# Patient Record
Sex: Male | Born: 1970
Health system: Southern US, Community
[De-identification: ages and names within clinical notes are randomized; demographics above are authoritative.]

## PROBLEM LIST (undated history)

## (undated) DIAGNOSIS — N200 Calculus of kidney: Secondary | ICD-10-CM

## (undated) DIAGNOSIS — K760 Fatty (change of) liver, not elsewhere classified: Secondary | ICD-10-CM

## (undated) DIAGNOSIS — R7303 Prediabetes: Secondary | ICD-10-CM

## (undated) DIAGNOSIS — G4734 Idiopathic sleep related nonobstructive alveolar hypoventilation: Principal | ICD-10-CM

## (undated) DIAGNOSIS — G4733 Obstructive sleep apnea (adult) (pediatric): Secondary | ICD-10-CM

## (undated) DIAGNOSIS — R42 Dizziness and giddiness: Secondary | ICD-10-CM

## (undated) HISTORY — DX: Dizziness and giddiness: R42

## (undated) HISTORY — DX: Obstructive sleep apnea (adult) (pediatric): G47.33

## (undated) HISTORY — DX: Calculus of kidney: N20.0

## (undated) HISTORY — PX: LITHOTRIPSY: SUR834

## (undated) HISTORY — PX: PALATE SURGERY: SHX729

## (undated) HISTORY — PX: TONSILLECTOMY AND ADENOIDECTOMY: SHX28

## (undated) HISTORY — DX: Idiopathic sleep related nonobstructive alveolar hypoventilation: G47.34

## (undated) HISTORY — DX: Fatty (change of) liver, not elsewhere classified: K76.0

## (undated) HISTORY — DX: Prediabetes: R73.03

---

## 1999-08-27 ENCOUNTER — Ambulatory Visit: Admission: RE | Admit: 1999-08-27 | Discharge: 1999-08-27 | Payer: Self-pay | Admitting: Otolaryngology

## 1999-12-06 ENCOUNTER — Inpatient Hospital Stay (HOSPITAL_COMMUNITY): Admission: RE | Admit: 1999-12-06 | Discharge: 1999-12-08 | Payer: Self-pay | Admitting: Otolaryngology

## 2000-06-21 ENCOUNTER — Emergency Department (HOSPITAL_COMMUNITY): Admission: EM | Admit: 2000-06-21 | Discharge: 2000-06-21 | Payer: Self-pay | Admitting: Emergency Medicine

## 2009-06-10 HISTORY — PX: KNEE SURGERY: SHX244

## 2010-11-29 ENCOUNTER — Encounter: Payer: Self-pay | Admitting: Family Medicine

## 2010-11-29 ENCOUNTER — Ambulatory Visit (INDEPENDENT_AMBULATORY_CARE_PROVIDER_SITE_OTHER): Payer: Self-pay | Admitting: Family Medicine

## 2010-11-29 VITALS — BP 132/75 | HR 71 | Ht 77.0 in | Wt 300.0 lb

## 2010-11-29 DIAGNOSIS — R42 Dizziness and giddiness: Secondary | ICD-10-CM

## 2010-11-29 NOTE — Progress Notes (Signed)
  Subjective:    Patient ID: Adam Hamilton, male    DOB: 01/09/1971, 40 y.o.   MRN: [id number]  HPI Dizziness with standing up x 6 months.  3 days a week driving an hour to work. Can happen after he has been is sitting for an extended period of time. No so much at home.  Will get up to walk and will all the sudden get dizzy.  Feels fain like he is going to pass out.  After 15-20 seconds and then resolve. No room spinning.  No blood in your urine or stool.  He does have one kidney larger than the other.  Does have frequent kidney stones. No recent vision changes.  Normal eye exam.  No HA.  No numbness or tingling now but when he stands up he feels tingling starts in his waist and radiates down his legs. Tingling in the tops of his feet.  No leg swelling. No syncopal episodes.    Review of Systems     Objective:   Physical Exam  Constitutional: He is oriented to person, place, and time. He appears well-developed and well-nourished.  HENT:  Head: Normocephalic and atraumatic.  Right Ear: External ear normal.  Left Ear: External ear normal.  Nose: Nose normal.  Mouth/Throat: Oropharynx is clear and moist.       TMs and canals are clear.   Eyes: Conjunctivae and EOM are normal. Pupils are equal, round, and reactive to light.  Neck: Neck supple. No thyromegaly present.  Cardiovascular: Normal rate, regular rhythm and normal heart sounds.        NO carotid or abdominal bruits.   Pulmonary/Chest: Effort normal and breath sounds normal.  Abdominal: Bowel sounds are normal. He exhibits no distension and no mass.  Musculoskeletal: He exhibits no edema.  Lymphadenopathy:    He has no cervical adenopathy.  Neurological: He is alert and oriented to person, place, and time.  Skin: Skin is warm and dry.  Psychiatric: He has a normal mood and affect.          Assessment & Plan:  Dizziness/lightheadedness - Discussed may be a delayed reaction in his vasculature to respond to position change vs  pooling of blood in his legs when sitting for long periods ( as it doesn't happen when he sits with his legs propped up). Will rule out anemia, thyroid d/o, etc.  In the meantime prop legs when sitting at his desk.  Don't let the edge of the chair press on the backs of his legs too much. He is not having CP or palps so arrythmias is unlikely. Can still consider cards referral if labs are all normal.  Rec quitting smoking.  I didn't perform an EKG today.

## 2010-11-29 NOTE — Patient Instructions (Signed)
We will call you with your lab results.  Can schedule your physical anytime you would like.

## 2010-11-30 ENCOUNTER — Telehealth: Payer: Self-pay | Admitting: Family Medicine

## 2010-11-30 ENCOUNTER — Encounter: Payer: Self-pay | Admitting: Family Medicine

## 2010-11-30 DIAGNOSIS — R42 Dizziness and giddiness: Secondary | ICD-10-CM

## 2010-11-30 LAB — CBC WITH DIFFERENTIAL/PLATELET
Basophils Absolute: 0.1 10*3/uL (ref 0.0–0.1)
Eosinophils Relative: 4 % (ref 0–5)
HCT: 46.1 % (ref 39.0–52.0)
Lymphocytes Relative: 41 % (ref 12–46)
MCHC: 33.2 g/dL (ref 30.0–36.0)
MCV: 90.4 fL (ref 78.0–100.0)
Monocytes Absolute: 1.2 10*3/uL — ABNORMAL HIGH (ref 0.1–1.0)
Monocytes Relative: 9 % (ref 3–12)
RDW: 14.1 % (ref 11.5–15.5)

## 2010-11-30 LAB — COMPLETE METABOLIC PANEL WITH GFR
ALT: 34 U/L (ref 0–53)
AST: 24 U/L (ref 0–37)
Albumin: 4.5 g/dL (ref 3.5–5.2)
Calcium: 9.9 mg/dL (ref 8.4–10.5)
Chloride: 104 mEq/L (ref 96–112)
Potassium: 4.4 mEq/L (ref 3.5–5.3)
Sodium: 139 mEq/L (ref 135–145)

## 2010-11-30 NOTE — Telephone Encounter (Signed)
Pt informed of all labs normal.  Pt is in agreement to go to a cardiologist but prefers someone local.  Told pt someone will be in touch with him about the referral. Jarvis Newcomer, LPN [facility] Routed to Dr. Marlyne Beards, LPN [facility]

## 2010-11-30 NOTE — Telephone Encounter (Signed)
Call patient: Complete metabolic panel looks great. Normal kidney function and, renal function and electrolytes. He has no sign of anemia. Her thyroid looks great. His white count is just borderline elevated. This could be if he's had a recent viral infection. Since his blood work is all normal I would actually like to schedule him with a cardiologist for his dizziness. If he is okay with this please let me know.

## 2010-12-25 ENCOUNTER — Encounter: Payer: Self-pay | Admitting: Cardiology

## 2010-12-26 ENCOUNTER — Encounter: Payer: Self-pay | Admitting: Cardiology

## 2010-12-26 ENCOUNTER — Ambulatory Visit (INDEPENDENT_AMBULATORY_CARE_PROVIDER_SITE_OTHER): Payer: 59 | Admitting: Cardiology

## 2010-12-26 DIAGNOSIS — Z720 Tobacco use: Secondary | ICD-10-CM

## 2010-12-26 DIAGNOSIS — F17200 Nicotine dependence, unspecified, uncomplicated: Secondary | ICD-10-CM

## 2010-12-26 DIAGNOSIS — R42 Dizziness and giddiness: Secondary | ICD-10-CM | POA: Insufficient documentation

## 2010-12-26 NOTE — Assessment & Plan Note (Signed)
Patient's symptoms sound orthostatic mediated. He is not orthostatic in the office but was not seated for an extended amount of time. I asked him to stand slowly. I asked him to increase his fluid and salt intake and I think this will help with his symptoms. Note he has not had syncope. ECG normal.

## 2010-12-26 NOTE — Assessment & Plan Note (Signed)
Patient counseled on discontinuing. 

## 2010-12-26 NOTE — Progress Notes (Signed)
HPI: 40 year old male with no prior cardiac history for evaluation of dizziness. Patient states that for the past 6 months he has episodes of dizziness. These always occur after sitting for extended amount of time and then standing. He will feel dizzy for 5-15 seconds. It then resolves. He has not had syncope. He does not have these episodes when he keeps his feet elevated while sitting. He otherwise denies dyspnea on exertion, orthopnea, PND, pedal edema, palpitations, syncope or chest pain. Because of the above we were asked to further evaluate.  No current outpatient prescriptions on file.    No Known Allergies  Past Medical History  Diagnosis Date  . Kidney stones   . Postural dizziness   . OSA (obstructive sleep apnea)     Past Surgical History  Procedure Date  . Knee surgery 2001  . Tonsillectomy and adenoidectomy     For snoring.     History   Social History  . Marital Status: Unknown    Spouse Name: Angie    Number of Children: 3  . Years of Education: 2   Occupational History  . Teaching laboratory technician   Social History Main Topics  . Smoking status: Current Everyday Smoker -- 1.0 packs/day for 20 years  . Smokeless tobacco: Not on file  . Alcohol Use: 0.5 - 2.5 oz/week    1-5 drink(s) per week     Rarely  . Drug Use: No  . Sexually Active: Yes   Other Topics Concern  . Not on file   Social History Narrative   2-3 caffein drinks per day. No regular exercise.     Family History  Problem Relation Age of Onset  . Diabetes Mother   . Stroke Mother 15    ROS: no fevers or chills, productive cough, hemoptysis, dysphasia, odynophagia, melena, hematochezia, dysuria, hematuria, rash, seizure activity, orthopnea, PND, pedal edema, claudication. Remaining systems are negative.  Physical Exam: General:  Well developed/well nourished in NAD Skin warm/dry Patient not depressed No peripheral  clubbing Back-normal HEENT-normal/normal eyelids Neck supple/normal carotid upstroke bilaterally; no bruits; no JVD; no thyromegaly chest - CTA/ normal expansion CV - RRR/normal S1 and S2; no murmurs, rubs or gallops;  PMI nondisplaced; no change with valsalva Abdomen -NT/ND, no HSM, no mass, + bowel sounds, no bruit 2+ femoral pulses, no bruits Ext-no edema, chords, 2+ DP Neuro-grossly nonfocal  ECG Normal sinus rhythm at a rate of 70. No ST changes.

## 2011-10-29 ENCOUNTER — Encounter: Payer: Self-pay | Admitting: Family Medicine

## 2011-10-29 ENCOUNTER — Ambulatory Visit (INDEPENDENT_AMBULATORY_CARE_PROVIDER_SITE_OTHER): Payer: BC Managed Care – PPO | Admitting: Family Medicine

## 2011-10-29 ENCOUNTER — Ambulatory Visit (HOSPITAL_BASED_OUTPATIENT_CLINIC_OR_DEPARTMENT_OTHER)
Admission: RE | Admit: 2011-10-29 | Discharge: 2011-10-29 | Disposition: A | Discharge status: Home / Self Care | Payer: BC Managed Care – PPO | Source: Ambulatory Visit | Attending: Family Medicine | Admitting: Family Medicine

## 2011-10-29 VITALS — BP 126/84 | HR 76 | Temp 98.2°F | Ht 76.0 in | Wt 282.0 lb

## 2011-10-29 DIAGNOSIS — X500XXA Overexertion from strenuous movement or load, initial encounter: Secondary | ICD-10-CM

## 2011-10-29 DIAGNOSIS — S8990XA Unspecified injury of unspecified lower leg, initial encounter: Secondary | ICD-10-CM

## 2011-10-29 DIAGNOSIS — S99919A Unspecified injury of unspecified ankle, initial encounter: Secondary | ICD-10-CM

## 2011-10-29 DIAGNOSIS — S99929A Unspecified injury of unspecified foot, initial encounter: Secondary | ICD-10-CM

## 2011-10-29 DIAGNOSIS — M79609 Pain in unspecified limb: Secondary | ICD-10-CM | POA: Insufficient documentation

## 2011-10-29 DIAGNOSIS — S99922A Unspecified injury of left foot, initial encounter: Secondary | ICD-10-CM

## 2011-10-29 DIAGNOSIS — M7730 Calcaneal spur, unspecified foot: Secondary | ICD-10-CM | POA: Insufficient documentation

## 2011-10-29 NOTE — Progress Notes (Signed)
  Subjective:    Patient ID: Adam Hamilton, male    DOB: 03-24-1971, 41 y.o.   MRN: [id number]  PCP: Dr. Linford Arnold  HPI 41 yo M here for left foot injury.  Patient reports on 07/13/11 while dancing he forcefully put left foot on ground and felt a sharp pain at heel. Was able to continue with activities that day but in morning had fairly severe pain, was limping as a result. Swelling has improved. Has been using tennis ball, icing, stretching on curb. No prior injuries to this foot.  Past Medical History  Diagnosis Date  . Kidney stones   . Postural dizziness   . OSA (obstructive sleep apnea)     No current outpatient prescriptions on file prior to visit.    Past Surgical History  Procedure Date  . Knee surgery 2001  . Tonsillectomy and adenoidectomy     For snoring.     No Known Allergies  History   Social History  . Marital Status: Married    Spouse Name: Angie    Number of Children: 3  . Years of Education: 2   Occupational History  . Teaching laboratory technician   Social History Main Topics  . Smoking status: Current Everyday Smoker -- 0.5 packs/day for 20 years  . Smokeless tobacco: Not on file  . Alcohol Use: 0.5 - 2.5 oz/week    1-5 drink(s) per week     Rarely  . Drug Use: No  . Sexually Active: Yes   Other Topics Concern  . Not on file   Social History Narrative   2-3 caffein drinks per day. No regular exercise.     Family History  Problem Relation Age of Onset  . Diabetes Mother   . Stroke Mother 57  . Hypertension Mother   . Heart attack Neg Hx   . Hyperlipidemia Neg Hx   . Sudden death Neg Hx     BP 126/84  Pulse 76  Temp(Src) 98.2 F (36.8 C) (Oral)  Ht 6\' 4"  (1.93 m)  Wt 282 lb (127.914 kg)  BMI 34.33 kg/m2  Review of Systems See HPI above.    Objective:   Physical Exam Gen: NAD  L foot/ankle: No gross deformity, swelling, ecchymoses. Cavus arches. FROM TTP anterior plantar calcaneus.   No other TTP about foot or ankle. Negative ant drawer and talar tilt.   Negative syndesmotic compression. Thompsons test negative. NV intact distally.    Assessment & Plan:  1. Left foot injury - radiographs negative for fracture.  History and exam consistent with plantar fascia strain/partial tear.  Arch straps provided. Discussed OTC orthotics with good arch support given his cavus deformities.  Icing, tylenol/nsaids as needed.  Home rehab program shown.  Consider formal PT, injection if not improving as expected.

## 2011-10-29 NOTE — Assessment & Plan Note (Signed)
radiographs negative for fracture.  History and exam consistent with plantar fascia strain/partial tear.  Arch straps provided. Discussed OTC orthotics with good arch support given his cavus deformities.  Icing, tylenol/nsaids as needed.  Home rehab program shown.  Consider formal PT, injection if not improving as expected.

## 2011-10-29 NOTE — Patient Instructions (Signed)
You have strained your plantar fascia (partially tore plantar fascia fibers). Take tylenol and/or aleve as needed for pain  Plantar fascia stretch for 20-30 seconds (do 3 of these) in morning Lowering/raise on a step exercises 3 x 10 once a day - this is very important for long term recovery. Can add heel walks, toe walks forward and backward as well Ice heel for 15 minutes as needed. Avoid flat shoes/barefoot walking as much as possible. Arch straps have been shown to help with pain. Heel lifts may help with pain by avoiding fully stretching the plantar fascia except when doing home exercises. Orthotics with heel lift may be helpful especially with your very high arches. Steroid injection is a consideration for short term pain relief if you are struggling. Physical therapy is also an option.

## 2012-06-22 ENCOUNTER — Encounter: Payer: Self-pay | Admitting: *Deleted

## 2012-06-22 ENCOUNTER — Emergency Department (INDEPENDENT_AMBULATORY_CARE_PROVIDER_SITE_OTHER)
Admission: EM | Admit: 2012-06-22 | Discharge: 2012-06-22 | Disposition: A | Discharge status: Home / Self Care | Payer: BC Managed Care – PPO | Source: Home / Self Care | Attending: Family Medicine | Admitting: Family Medicine

## 2012-06-22 DIAGNOSIS — H9390 Unspecified disorder of ear, unspecified ear: Secondary | ICD-10-CM

## 2012-06-22 DIAGNOSIS — J111 Influenza due to unidentified influenza virus with other respiratory manifestations: Secondary | ICD-10-CM

## 2012-06-22 MED ORDER — OSELTAMIVIR PHOSPHATE 75 MG PO CAPS: 75.0000 mg | ORAL_CAPSULE | Freq: Two times a day (BID) | ORAL | Status: DC

## 2012-06-22 NOTE — ED Notes (Signed)
Pt c/o fever, chills, body ache, and headache x 3 days. Has not tried anything at home and did not receive flu shot this year.

## 2012-06-22 NOTE — ED Provider Notes (Signed)
History     CSN: [id number]  Arrival date & time 06/22/12  1745   First MD Initiated Contact with Patient 06/22/12 1756      Chief Complaint  Patient presents with  . Fever  . Chills  . Headache      HPI Comments: Patient complains of fever, chills, body aches, and headache for 3 days, now worse today.  No cough.  His left ear feels clogged.  He has not had flu immunization this year.  The history is provided by the patient.    Past Medical History  Diagnosis Date  . Kidney stones   . Postural dizziness   . OSA (obstructive sleep apnea)     Past Surgical History  Procedure Date  . Knee surgery 2001  . Tonsillectomy and adenoidectomy     For snoring.     Family History  Problem Relation Age of Onset  . Diabetes Mother   . Stroke Mother 53  . Hypertension Mother   . Heart attack Neg Hx   . Hyperlipidemia Neg Hx   . Sudden death Neg Hx     History  Substance Use Topics  . Smoking status: Current Every Day Smoker -- 0.5 packs/day for 20 years  . Smokeless tobacco: Not on file  . Alcohol Use: 0.5 - 2.5 oz/week    1-5 drink(s) per week     Comment: Rarely      Review of Systems No sore throat No cough No pleuritic pain No wheezing No nasal congestion ? post-nasal drainage No sinus pain/pressure No itchy/red eyes No earache, but left ear feels somewhat congested No hemoptysis No SOB No fever, + chills No nausea No vomiting No abdominal pain No diarrhea No urinary symptoms No skin rashes + fatigue + myalgias + headache   Allergies  Review of patient's allergies indicates no known allergies.  Home Medications   Current Outpatient Rx  Name  Route  Sig  Dispense  Refill  . OSELTAMIVIR PHOSPHATE 75 MG PO CAPS   Oral   Take 1 capsule (75 mg total) by mouth every 12 (twelve) hours.   10 capsule   0     BP 135/81  Pulse 86  Temp 100.1 F (37.8 C) (Oral)  Resp 20  Ht 6' 4.5" (1.943 m)  Wt 302 lb 8 oz (137.213 kg)  BMI 36.34 kg/m2   SpO2 98%  Physical Exam Nursing notes and Vital Signs reviewed. Appearance:  Patient appears stated age, and in no acute distress.  Patient is obese (BMI 36.3) Eyes:  Pupils are equal, round, and reactive to light and accomodation.  Extraocular movement is intact.  Conjunctivae are not inflamed  Ears:  Canals normal.  Right tympanic membrane appears normal but may have some serous effusion; left tympanic membrane appears normal Nose:  Mildly congested turbinates.  No sinus tenderness.  Pharynx:  Normal Neck:  Supple.   Tender shotty posterior nodes are palpated bilaterally  Lungs:  Clear to auscultation.  Breath sounds are equal.  Heart:  Regular rate and rhythm without murmurs, rubs, or gallops.  Abdomen:  Nontender without masses or hepatosplenomegaly.  Bowel sounds are present.  No CVA or flank tenderness.  Extremities:  No edema.  No calf tenderness Skin:  No rash present.   ED Course  Procedures  none   Labs Reviewed  POCT INFLUENZA A/B negative Tympanometry:  Normal left ear; positive peak pressure right ear      1. Influenza-like illness  MDM  Despite negative flu test, will begin empiric Tamiflu. Take Mucinex D (guaifenesin with decongestant) twice daily for congestion.  Increase fluid intake, rest. May use Afrin nasal spray (or generic oxymetazoline) twice daily for about 5 days.  Also recommend using saline nasal spray several times daily and saline nasal irrigation (AYR is a common brand) Stop all antihistamines for now, and other non-prescription cough/cold preparations. May take Ibuprofen 200mg , 4 tabs every 8 hours with food for body aches, fever, headache, etc. If a cough develops, may take Delsym Cough Suppressant at bedtime for nighttime cough.  Follow-up with family doctor if not improving about 5 to 7 days.        Lattie Haw, MD 06/22/12 [phone]

## 2013-01-12 ENCOUNTER — Ambulatory Visit (INDEPENDENT_AMBULATORY_CARE_PROVIDER_SITE_OTHER): Payer: BC Managed Care – PPO | Admitting: Family Medicine

## 2013-01-12 ENCOUNTER — Encounter: Payer: Self-pay | Admitting: Family Medicine

## 2013-01-12 VITALS — BP 119/76 | HR 65 | Ht 76.0 in | Wt 309.0 lb

## 2013-01-12 DIAGNOSIS — M25579 Pain in unspecified ankle and joints of unspecified foot: Secondary | ICD-10-CM

## 2013-01-12 DIAGNOSIS — R109 Unspecified abdominal pain: Secondary | ICD-10-CM

## 2013-01-12 DIAGNOSIS — Z0000 Encounter for general adult medical examination without abnormal findings: Secondary | ICD-10-CM

## 2013-01-12 DIAGNOSIS — Z23 Encounter for immunization: Secondary | ICD-10-CM

## 2013-01-12 DIAGNOSIS — Z8042 Family history of malignant neoplasm of prostate: Secondary | ICD-10-CM

## 2013-01-12 DIAGNOSIS — R319 Hematuria, unspecified: Secondary | ICD-10-CM

## 2013-01-12 LAB — POCT URINALYSIS DIPSTICK
Bilirubin, UA: NEGATIVE
Ketones, UA: NEGATIVE
Spec Grav, UA: <=1.005
pH, UA: 6

## 2013-01-12 NOTE — Addendum Note (Signed)
Addended by: Nani Gasser D on: 01/12/2013 06:26 PM   Modules accepted: Level of Service

## 2013-01-12 NOTE — Progress Notes (Addendum)
Subjective:    Patient ID: Adam Hamilton, male    DOB: [birth year], 42 y.o.   MRN: [id number]  HPI Here for CPE- does have some complaints. Quit smoking about a year ago.   Left sided abd pain x 8 months.  Says his dog lays on that side.  He is worried as has cancer in his family. No blood in the stool.  Feels like a pressure.  No fever or chils. Does get nauseated with it.  Abd pain will usually last for several hours, then resolved.  No worsening or alleviating. Bearing down can trigger it.  No change in bowel habits.  Not related to eating, not eating or bowel movements.   He also has 2 swollen areas on the lower legs bilaterally on the outer part of the leg a few inches above each ankle. He says they have been there for years. He says when he walks a lot actually get more hard and swollen. Otherwise they are not very bothersome. They are symmetric.  C/O of bilat foot pain for several months. Says worse when first stand on ball of foot.  No heel pain. Worse with more activity.  Rest improves sxs.  Has tried heel cushion with arch supports with no improvments. Feels like toes intermittantly go numb.  No swelling or redness of the joints.   Review of Systems Comprehensive ROS is neg except for above  BP 119/76  Pulse 65  Ht 6\' 4"  (1.93 m)  Wt 309 lb (140.161 kg)  BMI 37.63 kg/m2    No Known Allergies  Past Medical History  Diagnosis Date  . Kidney stones   . Postural dizziness   . OSA (obstructive sleep apnea)     Past Surgical History  Procedure Laterality Date  . Knee surgery  2001  . Tonsillectomy and adenoidectomy      For snoring.     History   Social History  . Marital Status: Married    Spouse Name: Angie    Number of Children: 3  . Years of Education: 2   Occupational History  . Teaching laboratory technician   Social History Main Topics  . Smoking status: Former Smoker -- 0.50 packs/day for 20 years    Quit date: 01/13/2012   . Smokeless tobacco: Not on file  . Alcohol Use: .5 - 2.5 oz/week    1-5 drink(s) per week     Comment: Rarely  . Drug Use: No  . Sexually Active: Yes   Other Topics Concern  . Not on file   Social History Narrative   2-3 caffein drinks per day. Some regular exercise.     Family History  Problem Relation Age of Onset  . Diabetes Mother   . Stroke Mother 35  . Hypertension Mother   . Heart attack Neg Hx   . Hyperlipidemia Neg Hx   . Sudden death Neg Hx   . Stomach cancer Father 62  . Lymphoma Paternal Grandfather 63  . Prostate cancer Paternal Grandfather     Outpatient Encounter Prescriptions as of 01/12/2013  Medication Sig Dispense Refill  . [DISCONTINUED] oseltamivir (TAMIFLU) 75 MG capsule Take 1 capsule (75 mg total) by mouth every 12 (twelve) hours.  10 capsule  0   No facility-administered encounter medications on file as of 01/12/2013.          Objective:   Physical Exam  Constitutional: He is oriented to person, place, and time.  He appears well-developed and well-nourished.  HENT:  Head: Normocephalic and atraumatic.  Right Ear: External ear normal.  Left Ear: External ear normal.  Nose: Nose normal.  Mouth/Throat: Oropharynx is clear and moist.  Eyes: Conjunctivae and EOM are normal. Pupils are equal, round, and reactive to light.  Neck: Normal range of motion. Neck supple. No thyromegaly present.  Cardiovascular: Normal rate, regular rhythm, normal heart sounds and intact distal pulses.   Pulmonary/Chest: Effort normal and breath sounds normal.  Abdominal: Soft. Bowel sounds are normal. He exhibits no distension and no mass. There is tenderness. There is no rebound and no guarding.  He is very tender with light and deep palpation in the left lower quadrant. No CVA tenderness.  Musculoskeletal: Normal range of motion.  He does have 2 soft nodules on the outer lower legs. There've symmetric and bilateral. They fill consistent with a lipoma but the symmetry  is a little unusual. I would like for him to see my partner Dr. Rodney Langton for possible ultrasound for further evaluation. He is tender over the ball of his feet on both feet especially the first through fourth digits. He does have a little grinding between the first and second toe on the right foot. Normal flexion and extension of the toes and ankle. Normal range of motion of the ankle. No swelling of the ankles.  Lymphadenopathy:    He has no cervical adenopathy.  Neurological: He is alert and oriented to person, place, and time. He has normal reflexes.  Skin: Skin is warm and dry.  Psychiatric: He has a normal mood and affect. His behavior is normal. Judgment and thought content normal.          Assessment & Plan:  CPE-  Keep up a regular exercise program and make sure you are eating a healthy diet Try to eat 4 servings of dairy a day, or if you are lactose intolerant take a calcium with vitamin D daily.  Your vaccines are up to date.  CMp, lipids, thyroid, and CBC w/ diff.    Tdap - given today.   Left lower quadrnat abdominal pain- he is very tender on exam which is concerning. I don't palpate a hernia over the abdominal wall. .  Will refer to CT abd/pelvis for further evaluation.  Consider diverticulutis. Wlil check CBC. UA + for blood, will send for micro and culture.   Bilat foot pain - likely metatarsalgia vs possile neuroma.  Recommend MT pad. Medium size given to try in shoes. Showed him how to insert them. F/U with my partner, Dr. Benjamin Stain if not improving.    Lesion on bilat legs- feel like a lipoma on exam but unusual to be bilat symmetric. Will refer to Dr. Karie Schwalbe for further diagnostic w/u.

## 2013-01-12 NOTE — Patient Instructions (Signed)
Keep up a regular exercise program and make sure you are eating a healthy diet Try to eat 4 servings of dairy a day, or if you are lactose intolerant take a calcium with vitamin D daily.  Your vaccines are up to date.   

## 2013-01-13 ENCOUNTER — Telehealth: Payer: Self-pay | Admitting: *Deleted

## 2013-01-13 ENCOUNTER — Encounter: Payer: Self-pay | Admitting: Family Medicine

## 2013-01-13 LAB — CBC WITH DIFFERENTIAL/PLATELET
Basophils Absolute: 0.1 10*3/uL (ref 0.0–0.1)
Basophils Relative: 1 % (ref 0–1)
HCT: 42.9 % (ref 39.0–52.0)
Hemoglobin: 14.9 g/dL (ref 13.0–17.0)
Lymphocytes Relative: 41 % (ref 12–46)
MCHC: 34.7 g/dL (ref 30.0–36.0)
Monocytes Absolute: 1.1 10*3/uL — ABNORMAL HIGH (ref 0.1–1.0)
Neutro Abs: 5.2 10*3/uL (ref 1.7–7.7)
Neutrophils Relative %: 46 % (ref 43–77)
RDW: 14.5 % (ref 11.5–15.5)
WBC: 11 10*3/uL — ABNORMAL HIGH (ref 4.0–10.5)

## 2013-01-13 LAB — COMPLETE METABOLIC PANEL WITHOUT GFR
ALT: 43 U/L (ref 0–53)
AST: 28 U/L (ref 0–37)
Albumin: 4.6 g/dL (ref 3.5–5.2)
Alkaline Phosphatase: 59 U/L (ref 39–117)
BUN: 12 mg/dL (ref 6–23)
CO2: 27 meq/L (ref 19–32)
Calcium: 10 mg/dL (ref 8.4–10.5)
Chloride: 101 meq/L (ref 96–112)
Creat: 0.91 mg/dL (ref 0.50–1.35)
GFR, Est African American: >89 mL/min
GFR, Est Non African American: >89 mL/min
Glucose, Bld: 87 mg/dL (ref 70–99)
Potassium: 4 meq/L (ref 3.5–5.3)
Sodium: 137 meq/L (ref 135–145)
Total Bilirubin: 0.8 mg/dL (ref 0.3–1.2)
Total Protein: 7.4 g/dL (ref 6.0–8.3)

## 2013-01-13 LAB — LIPID PANEL
Cholesterol: 139 mg/dL (ref 0–200)
HDL: 30 mg/dL — ABNORMAL LOW
LDL Cholesterol: 74 mg/dL (ref 0–99)
Total CHOL/HDL Ratio: 4.6 ratio
Triglycerides: 173 mg/dL — ABNORMAL HIGH
VLDL: 35 mg/dL (ref 0–40)

## 2013-01-13 LAB — URINALYSIS, ROUTINE W REFLEX MICROSCOPIC
Glucose, UA: NEGATIVE mg/dL
Leukocytes, UA: NEGATIVE
Nitrite: NEGATIVE
Specific Gravity, Urine: 1.007 (ref 1.005–1.030)
pH: 5.5 (ref 5.0–8.0)

## 2013-01-13 LAB — PSA: PSA: 0.57 ng/mL

## 2013-01-13 NOTE — Telephone Encounter (Signed)
Prior auth obtained for CT of abd and pelvis with contrast [id number].Adam Hamilton [place]

## 2013-01-14 ENCOUNTER — Ambulatory Visit (INDEPENDENT_AMBULATORY_CARE_PROVIDER_SITE_OTHER): Payer: BC Managed Care – PPO

## 2013-01-14 DIAGNOSIS — R1032 Left lower quadrant pain: Secondary | ICD-10-CM

## 2013-01-14 DIAGNOSIS — R109 Unspecified abdominal pain: Secondary | ICD-10-CM

## 2013-01-14 DIAGNOSIS — R1013 Epigastric pain: Secondary | ICD-10-CM

## 2013-01-14 MED ORDER — IOHEXOL 300 MG/ML  SOLN: 100.0000 mL | Freq: Once | INTRAMUSCULAR | Status: AC | PRN

## 2013-04-15 ENCOUNTER — Other Ambulatory Visit: Payer: Self-pay

## 2013-06-22 ENCOUNTER — Encounter: Payer: Self-pay | Admitting: Family Medicine

## 2013-06-22 ENCOUNTER — Ambulatory Visit (INDEPENDENT_AMBULATORY_CARE_PROVIDER_SITE_OTHER): Payer: BC Managed Care – PPO | Admitting: Family Medicine

## 2013-06-22 ENCOUNTER — Ambulatory Visit (INDEPENDENT_AMBULATORY_CARE_PROVIDER_SITE_OTHER): Payer: BC Managed Care – PPO

## 2013-06-22 VITALS — BP 108/62 | HR 78 | Wt 316.0 lb

## 2013-06-22 DIAGNOSIS — R110 Nausea: Secondary | ICD-10-CM

## 2013-06-22 DIAGNOSIS — K5900 Constipation, unspecified: Secondary | ICD-10-CM

## 2013-06-22 DIAGNOSIS — R1032 Left lower quadrant pain: Secondary | ICD-10-CM

## 2013-06-22 DIAGNOSIS — R195 Other fecal abnormalities: Secondary | ICD-10-CM

## 2013-06-22 LAB — CBC WITH DIFFERENTIAL/PLATELET
Basophils Absolute: 0.1 10*3/uL (ref 0.0–0.1)
Basophils Relative: 1 % (ref 0–1)
EOS ABS: 0.2 10*3/uL (ref 0.0–0.7)
Eosinophils Relative: 2 % (ref 0–5)
HCT: 43 % (ref 39.0–52.0)
Hemoglobin: 14.8 g/dL (ref 13.0–17.0)
LYMPHS ABS: 3.2 10*3/uL (ref 0.7–4.0)
LYMPHS PCT: 34 % (ref 12–46)
MCH: 30.1 pg (ref 26.0–34.0)
MCHC: 34.4 g/dL (ref 30.0–36.0)
MCV: 87.4 fL (ref 78.0–100.0)
Monocytes Absolute: 1 10*3/uL (ref 0.1–1.0)
Monocytes Relative: 10 % (ref 3–12)
NEUTROS PCT: 53 % (ref 43–77)
Neutro Abs: 5.1 10*3/uL (ref 1.7–7.7)
PLATELETS: 254 10*3/uL (ref 150–400)
RBC: 4.92 MIL/uL (ref 4.22–5.81)
RDW: 14.5 % (ref 11.5–15.5)
WBC: 9.6 10*3/uL (ref 4.0–10.5)

## 2013-06-22 LAB — COMPLETE METABOLIC PANEL WITH GFR
ALT: 49 U/L (ref 0–53)
AST: 31 U/L (ref 0–37)
Albumin: 4.4 g/dL (ref 3.5–5.2)
Alkaline Phosphatase: 55 U/L (ref 39–117)
BUN: 13 mg/dL (ref 6–23)
CALCIUM: 9.7 mg/dL (ref 8.4–10.5)
CHLORIDE: 104 meq/L (ref 96–112)
CO2: 29 meq/L (ref 19–32)
CREATININE: 0.85 mg/dL (ref 0.50–1.35)
GFR, Est Non African American: >89 mL/min
Glucose, Bld: 98 mg/dL (ref 70–99)
Potassium: 4.4 mEq/L (ref 3.5–5.3)
Sodium: 140 mEq/L (ref 135–145)
Total Bilirubin: 0.6 mg/dL (ref 0.3–1.2)
Total Protein: 7.1 g/dL (ref 6.0–8.3)

## 2013-06-22 NOTE — Progress Notes (Signed)
Subjective:    Patient ID: Adam Hamilton, male    DOB: 1971-05-08, 43 y.o.   MRN: [id number]  HPI Says can constantly feel nausea and intensity ebbs and flows for 1.5 months. .  Has had some blood when wipes, bright red.  He says will get urge to have BM and then can't.  Denies constipation, stool is not formed, not normal. + bloated.  Says sometimes stools are pencil thin over the last month.  Not on any meds. Eating makes the nausea worse but not abdominal pain after eating.  Says has been eating a lot less but has actually gained weight. Has been eating smaller amounts.   Mother has colon polyps.  Father had stomach cancer ( dx 1 year ago).  GF had Hodgkins lymphoma.   Denies any fevers chills or sweats.  Review of Systems  BP 108/62  Pulse 78  Wt 316 lb (143.337 kg)  SpO2 96%    No Known Allergies  Past Medical History  Diagnosis Date  . Kidney stones   . Postural dizziness   . OSA (obstructive sleep apnea)     Past Surgical History  Procedure Laterality Date  . Knee surgery  2001  . Tonsillectomy and adenoidectomy      For snoring.     History   Social History  . Marital Status: Married    Spouse Name: Angie    Number of Children: 3  . Years of Education: 2   Occupational History  . Research officer, trade [employer]   Social History Main Topics  . Smoking status: Former Smoker -- 0.50 packs/day for 20 years    Quit date: 01/13/2012  . Smokeless tobacco: Not on file  . Alcohol Use: .5 - 2.5 oz/week    1-5 drink(s) per week     Comment: Rarely  . Drug Use: No  . Sexual Activity: Yes   Other Topics Concern  . Not on file   Social History Narrative   2-3 caffein drinks per day. Some regular exercise.     Family History  Problem Relation Age of Onset  . Diabetes Mother   . Stroke Mother 89  . Hypertension Mother   . Heart attack Neg Hx   . Hyperlipidemia Neg Hx   . Sudden death Neg Hx   . Stomach cancer Father 26  .  Lymphoma Paternal Grandfather 60  . Prostate cancer Paternal Grandfather     No outpatient encounter prescriptions on file as of 06/22/2013.          Objective:   Physical Exam  Constitutional: He is oriented to person, place, and time. He appears well-developed and well-nourished.  HENT:  Head: Normocephalic and atraumatic.  Cardiovascular: Normal rate, regular rhythm and normal heart sounds.   Pulmonary/Chest: Effort normal and breath sounds normal.  Abdominal: Soft. Bowel sounds are normal. He exhibits no distension and no mass. There is tenderness. There is no rebound and no guarding.  TTP in the Right LQ and the Left LQ.   Neurological: He is alert and oriented to person, place, and time.  Skin: Skin is warm and dry.  Psychiatric: He has a normal mood and affect. His behavior is normal.          Assessment & Plan:  Nausea-unclear etiology. Could be related to obstipation. We'll get KUB for further evaluation. We'll also check liver enzymes, electrolytes and a CBC with differential.  Constipation-the stools themselves are  soft but he definitely has to strain and is having the sensation ago but cannot. Will start MiraLax twice a day. One stools are moving more normally can decrease down to once a day. Will get KUB and blood work today. With a family history of stomach cancer and colon polyps I would like to refer him to GI for further evaluation as well. Guaiac was negative today.  Tender in the right lower and left lower quit trans-he is actually very tender on exam today. He felt extremely nauseated when I palpated the lower abdomen. Again we'll see what the KUB shows.

## 2013-06-22 NOTE — Patient Instructions (Signed)
Miralax twice a day. Until stools are soft.

## 2013-07-01 ENCOUNTER — Telehealth: Payer: Self-pay | Admitting: Family Medicine

## 2013-07-01 ENCOUNTER — Encounter: Payer: Self-pay | Admitting: Gastroenterology

## 2013-07-01 NOTE — Telephone Encounter (Signed)
appt reschd for 2.3.15.Marcine Gadway, [place] Crocker

## 2013-07-01 NOTE — Telephone Encounter (Signed)
Pt called.  Since he  he has not yet seen the Gastroenterologist, he wants to know if he should still keep his appt with Dr. Madilyn Fireman on Jan 27th(did not want the appt with Manning Charity on 1/26).

## 2013-07-05 ENCOUNTER — Ambulatory Visit: Payer: BC Managed Care – PPO | Admitting: Gastroenterology

## 2013-07-06 ENCOUNTER — Encounter: Payer: Self-pay | Admitting: Gastroenterology

## 2013-07-06 ENCOUNTER — Ambulatory Visit: Payer: BC Managed Care – PPO | Admitting: Family Medicine

## 2013-07-06 ENCOUNTER — Ambulatory Visit (INDEPENDENT_AMBULATORY_CARE_PROVIDER_SITE_OTHER): Payer: BC Managed Care – PPO | Admitting: Gastroenterology

## 2013-07-06 VITALS — BP 130/80 | HR 77 | Ht 76.0 in | Wt 315.0 lb

## 2013-07-06 DIAGNOSIS — R198 Other specified symptoms and signs involving the digestive system and abdomen: Secondary | ICD-10-CM

## 2013-07-06 DIAGNOSIS — R110 Nausea: Secondary | ICD-10-CM

## 2013-07-06 DIAGNOSIS — R109 Unspecified abdominal pain: Secondary | ICD-10-CM

## 2013-07-06 DIAGNOSIS — K625 Hemorrhage of anus and rectum: Secondary | ICD-10-CM

## 2013-07-06 MED ORDER — HYOSCYAMINE SULFATE 0.125 MG SL SUBL: SUBLINGUAL_TABLET | SUBLINGUAL | Status: DC

## 2013-07-06 MED ORDER — MOVIPREP 100 G PO SOLR: 1.0000 | Freq: Once | ORAL | Status: DC

## 2013-07-06 MED ORDER — ONDANSETRON HCL 4 MG PO TABS: 4.0000 mg | ORAL_TABLET | ORAL | Status: DC | PRN

## 2013-07-06 NOTE — Patient Instructions (Addendum)
We have sent the following medications to your pharmacy for you to pick up at your convenience:  Levsin, Zofran  You have been scheduled for an endoscopy and colonoscopy with propofol. Please follow the written instructions given to you at your visit today. Please pick up your prep at the pharmacy within the next 1-3 days. If you use inhalers (even only as needed), please bring them with you on the day of your procedure. Your physician has requested that you go to www.startemmi.com and enter the access code given to you at your visit today. This web site gives a general overview about your procedure. However, you should still follow specific instructions given to you by our office regarding your preparation for the procedure.  Please pick up a bottle of Magnesium Citrate and drink it today or tomorrow for your constipation

## 2013-07-08 ENCOUNTER — Encounter: Payer: Self-pay | Admitting: Gastroenterology

## 2013-07-08 DIAGNOSIS — K625 Hemorrhage of anus and rectum: Secondary | ICD-10-CM | POA: Insufficient documentation

## 2013-07-08 DIAGNOSIS — R198 Other specified symptoms and signs involving the digestive system and abdomen: Secondary | ICD-10-CM | POA: Insufficient documentation

## 2013-07-08 DIAGNOSIS — R109 Unspecified abdominal pain: Secondary | ICD-10-CM | POA: Insufficient documentation

## 2013-07-08 DIAGNOSIS — R110 Nausea: Secondary | ICD-10-CM | POA: Insufficient documentation

## 2013-07-08 NOTE — Progress Notes (Signed)
07/08/2013 AMATO SEVILLANO [id number] 1970/11/19   HISTORY OF PRESENT ILLNESS:  This is a 43 year old male who presents to our office today with several GI complaints.  He was referred here by his PCP, Dr. Madilyn Fireman.  First, he complains of nausea, which he says has been constant for the last several months. He says it does come and go but is present every day usually worse in the morning. He does not vomit. He's had a poor appetite as well. He complains of a lot of bloating and constantly feels like he has to have a bowel movement. When he does have a bowel movement he says that they are thin and difficult to come out. This has been occurring for the past 2-3 months as well. He does see occasional bright red blood on the toilet paper upon wiping. He complains of left lower quadrant abdominal pain, but it appears he's complained about this in the past; he had a CT scan of the abdomen and pelvis with contrast in August 2014 for complaints of left lower quadrant abdominal pain at which time the study only revealed some hepatic steatosis. He says that for the constipation he tried drinking MiraLax twice daily for approximately 5 days, but he did not notice any difference and and made him more bloated. He had a normal CBC and CMP, which were ordered by his PCP earlier this month. He states he does not have much problems with reflux and only experiences it less than once a week if he eats certain foods.  His father had stomach cancer and his mother has a history of colon polyps. He was heme negative at his PCP's office.   Past Medical History  Diagnosis Date  . Kidney stones   . Postural dizziness   . OSA (obstructive sleep apnea)    Past Surgical History  Procedure Laterality Date  . Knee surgery  2011  . Tonsillectomy and adenoidectomy      For snoring.     reports that he quit smoking about 17 months ago. He has never used smokeless tobacco. He reports that he drinks about 0.5 ounces of alcohol per  week. He reports that he does not use illicit drugs. family history includes Colon polyps in his mother; Diabetes in his mother; Hypertension in his mother; Lymphoma (age of onset: 62) in his paternal grandfather; Prostate cancer in his paternal grandfather; Stomach cancer (age of onset: 51) in his father; Stroke (age of onset: 58) in his mother. There is no history of Heart attack, Hyperlipidemia, or Sudden death. No Known Allergies    Outpatient Encounter Prescriptions as of 07/06/2013  Medication Sig  . hyoscyamine (LEVSIN SL) 0.125 MG SL tablet Take one tablet every 6 hours as needed for abdominal pain and cramping  . MOVIPREP 100 G SOLR Take 1 kit (200 g total) by mouth once.  . ondansetron (ZOFRAN) 4 MG tablet Take 1 tablet (4 mg total) by mouth every 4 (four) hours as needed for nausea or vomiting.     REVIEW OF SYSTEMS  : All other systems reviewed and negative except where noted in the History of Present Illness.   PHYSICAL EXAM: BP 130/80  Pulse 77  Ht $R'6\' 4"'PY$  (1.93 m)  Wt 315 lb (142.883 kg)  BMI 38.36 kg/m2 General: Well developed white male in no acute distress Head: Normocephalic and atraumatic Eyes:  Sclerae anicteric, conjunctiva pink. Ears: Normal auditory acuity.  Lungs: Clear throughout to auscultation Heart: Regular rate and rhythm Abdomen: Soft,  non-distended.  BS present.  Mild diffuse TTP > in the LLQ without R/R/G.  Complained of a lot of nausea when his abdomen was palpated. Rectal: Deferred.  Will be done at the time of colonoscopy. Musculoskeletal: Symmetrical with no gross deformities  Skin: No lesions on visible extremities Extremities: No edema  Neurological: Alert oriented x 4, grossly non-focal Psychological:  Alert and cooperative. Normal mood and affect  ASSESSMENT AND PLAN: -Nausea:  Constant.  Has history of stomach cancer in his father.  Will schedule EGD for further evaluation.  Will give Zofran to try for nausea in the interim. -Change in  bowel habits with rectal bleeding and LLQ abdominal pain:  Will schedule for colonoscopy as well.  Will check celiac labs.  He will get a bottle of magnesium citrate to drink and clean him out in the interim; will see if this relieves any of the discomfort as well.  Will give Levsin to try for cramping/spasm.  The risks, benefits, and alternatives were discussed with the patient and he consents to proceed.

## 2013-07-12 NOTE — Progress Notes (Signed)
i agree with the plan outlined above 

## 2013-07-13 ENCOUNTER — Ambulatory Visit: Payer: BC Managed Care – PPO | Admitting: Family Medicine

## 2013-07-14 ENCOUNTER — Ambulatory Visit (AMBULATORY_SURGERY_CENTER): Payer: BC Managed Care – PPO | Admitting: Gastroenterology

## 2013-07-14 ENCOUNTER — Encounter: Payer: Self-pay | Admitting: Gastroenterology

## 2013-07-14 VITALS — BP 103/61 | HR 69 | Temp 97.9°F | Resp 14 | Ht 78.0 in | Wt 315.0 lb

## 2013-07-14 DIAGNOSIS — K5900 Constipation, unspecified: Secondary | ICD-10-CM

## 2013-07-14 DIAGNOSIS — K2990 Gastroduodenitis, unspecified, without bleeding: Secondary | ICD-10-CM

## 2013-07-14 DIAGNOSIS — D1330 Benign neoplasm of unspecified part of small intestine: Secondary | ICD-10-CM

## 2013-07-14 DIAGNOSIS — K2970 Gastritis, unspecified, without bleeding: Secondary | ICD-10-CM

## 2013-07-14 DIAGNOSIS — K209 Esophagitis, unspecified without bleeding: Secondary | ICD-10-CM

## 2013-07-14 DIAGNOSIS — Z8371 Family history of colonic polyps: Secondary | ICD-10-CM

## 2013-07-14 DIAGNOSIS — R198 Other specified symptoms and signs involving the digestive system and abdomen: Secondary | ICD-10-CM

## 2013-07-14 DIAGNOSIS — R110 Nausea: Secondary | ICD-10-CM

## 2013-07-14 DIAGNOSIS — D131 Benign neoplasm of stomach: Secondary | ICD-10-CM

## 2013-07-14 MED ORDER — SODIUM CHLORIDE 0.9 % IV SOLN: 500.0000 mL | INTRAVENOUS | Status: DC

## 2013-07-14 NOTE — Patient Instructions (Addendum)
YOU HAD AN ENDOSCOPIC PROCEDURE TODAY AT THE [facility]: Refer to the procedure report that was given to you for any specific questions about what was found during the examination.  If the procedure report does not answer your questions, please call your gastroenterologist to clarify.  If you requested that your care partner not be given the details of your procedure findings, then the procedure report has been included in a sealed envelope for you to review at your convenience later.  YOU SHOULD EXPECT: Some feelings of bloating in the abdomen. Passage of more gas than usual.  Walking can help get rid of the air that was put into your GI tract during the procedure and reduce the bloating. If you had a lower endoscopy (such as a colonoscopy or flexible sigmoidoscopy) you may notice spotting of blood in your stool or on the toilet paper. If you underwent a bowel prep for your procedure, then you may not have a normal bowel movement for a few days.  DIET: Your first meal following the procedure should be a light meal and then it is ok to progress to your normal diet.  A half-sandwich or bowl of soup is an example of a good first meal.  Heavy or fried foods are harder to digest and may make you feel nauseous or bloated.  Likewise meals heavy in dairy and vegetables can cause extra gas to form and this can also increase the bloating.  Drink plenty of fluids but you should avoid alcoholic beverages for 24 hours.  ACTIVITY: Your care partner should take you home directly after the procedure.  You should plan to take it easy, moving slowly for the rest of the day.  You can resume normal activity the day after the procedure however you should NOT DRIVE or use heavy machinery for 24 hours (because of the sedation medicines used during the test).    SYMPTOMS TO REPORT IMMEDIATELY: A gastroenterologist can be reached at any hour.  During normal business hours, 8:30 AM to 5:00 PM Monday through Friday,  call [phone].  After hours and on weekends, please call the GI answering service at [phone] who will take a message and have the physician on call contact you.   Following lower endoscopy (colonoscopy or flexible sigmoidoscopy):  Excessive amounts of blood in the stool  Significant tenderness or worsening of abdominal pains  Swelling of the abdomen that is new, acute  Fever of 100F or higher  Following upper endoscopy (EGD)  Vomiting of blood or coffee ground material  New chest pain or pain under the shoulder blades  Painful or persistently difficult swallowing  New shortness of breath  Fever of 100F or higher  Black, tarry-looking stools  FOLLOW UP: If any biopsies were taken you will be contacted by phone or by letter within the next 1-3 weeks.  Call your gastroenterologist if you have not heard about the biopsies in 3 weeks.  Our staff will call the home number listed on your records the next business day following your procedure to check on you and address any questions or concerns that you may have at that time regarding the information given to you following your procedure. This is a courtesy call and so if there is no answer at the home number and we have not heard from you through the emergency physician on call, we will assume that you have returned to your regular daily activities without incident.  SIGNATURES/CONFIDENTIALITY: You and/or your care   partner have signed paperwork which will be entered into your electronic medical record.  These signatures attest to the fact that that the information above on your After Visit Summary has been reviewed and is understood.  Full responsibility of the confidentiality of this discharge information lies with you and/or your care-partner.  Diverticulum, gastritis, esophagitis, GERD-handouts given  Repeat colonoscopy for when you turn 78 for colon cancer screening.  Linzess samples given today. Call after 10 days to let Dr.  Edison Nasuti know how you are doing.  Wait biopsy results.

## 2013-07-14 NOTE — Op Note (Signed)
[place]  [address] [place], [id number]   COLONOSCOPY PROCEDURE REPORT  PATIENT: Adam Hamilton, Adam Hamilton  MR#: [id number] BIRTHDATE: [birth year] , 43  yrs. old GENDER: Male ENDOSCOPIST: Milus Banister, MD REFERRED [id number] Madilyn Fireman, M.D. PROCEDURE DATE:  07/14/2013 PROCEDURE:   Colonoscopy, diagnostic First Screening Colonoscopy - Avg.  risk and is 50 yrs.  old or older - No.  Prior Negative Screening - Now for repeat screening. N/A  History of Adenoma - Now for follow-up colonoscopy & has been > or = to 3 yrs.  N/A  Polyps Removed Today? No.  Recommend repeat exam, <10 yrs? No. ASA CLASS:   Class II INDICATIONS:change in bowels, constipation, family history of colon polyps. MEDICATIONS: Fentanyl 75 mcg IV, Versed 8 mg IV, and These medications were titrated to patient response per physician's verbal order  DESCRIPTION OF PROCEDURE:   After the risks benefits and alternatives of the procedure were thoroughly explained, informed consent was obtained.  A digital rectal exam revealed no abnormalities of the rectum.   The LB [id number]  endoscope was introduced through the anus and advanced to the cecum, which was identified by both the appendix and ileocecal valve. No adverse events experienced.   The quality of the prep was good.  The instrument was then slowly withdrawn as the colon was fully examined.   COLON FINDINGS: There were numerous diverticulum throughout the left colon.  The examination was otherwise normal.  Retroflexed views revealed no abnormalities. The time to cecum=3 minutes 14 seconds. Withdrawal time=6 minutes 17 seconds.  The scope was withdrawn and the procedure completed. COMPLICATIONS: There were no complications.  ENDOSCOPIC IMPRESSION: There were numerous diverticulum throughout the left colon. The examination was otherwise normal.  RECOMMENDATIONS: You should continue to follow colorectal cancer screening guidelines for  "routine risk" patients with a repeat colonoscopy in when you turn 50. Trial of linzess (low dose), samples given today. Take one pill once daily, call our office in 10 days to report on your response.  eSigned:  Milus Banister, MD 07/14/2013 2:59 PM

## 2013-07-14 NOTE — Op Note (Signed)
[place]  [address] [place], [id number]   ENDOSCOPY PROCEDURE REPORT  PATIENT: Adam, Hamilton  MR#: [id number] BIRTHDATE: 24-Oct-1970 , 43  yrs. old GENDER: Male ENDOSCOPIST: Milus Banister, MD PROCEDURE DATE:  07/14/2013 PROCEDURE:  EGD w/ biopsy ASA CLASS:     Class II INDICATIONS:  nausea. MEDICATIONS: There was residual sedation effect present from prior procedure, Versed 2 mg IV, and These medications were titrated to patient response per physician's verbal order TOPICAL ANESTHETIC: Cetacaine Spray  DESCRIPTION OF PROCEDURE: After the risks benefits and alternatives of the procedure were thoroughly explained, informed consent was obtained.  The LB [id number] endoscope was introduced through the mouth and advanced to the second portion of the duodenum. Without limitations.  The instrument was slowly withdrawn as the mucosa was fully examined.    There was mild, non-specific distal gastritis.  This was biopsied and sent to pathology.  There was distal esophagitis with irregular Z line, biopsies were taken to check for GERD, Barrett's.  The examination was otherwise normal.  The duodenum was biopsied to check for Celiac Sprue.  Retroflexed views revealed no abnormalities.     The scope was then withdrawn from the patient and the procedure completed.  COMPLICATIONS: There were no complications. ENDOSCOPIC IMPRESSION: There was mild, non-specific distal gastritis.  This was biopsied and sent to pathology.  There was distal esophagitis with irregular Z line, biopsies were taken to check for GERD, Barrett's.  The examination was otherwise normal.  The duodenum was biopsied to check for Celiac Sprue.  RECOMMENDATIONS: Await pathology results from esophagus, stomach and duodenum.   eSigned:  Milus Banister, MD 07/14/2013 3:12 PM   CC: Dr. Suzi Roots

## 2013-07-15 ENCOUNTER — Telehealth: Payer: Self-pay | Admitting: *Deleted

## 2013-07-15 NOTE — Telephone Encounter (Signed)
  Follow up Call-  Call back number 07/14/2013  Post procedure Call Back phone  # [phone]  Permission to leave phone message Yes     Patient questions:  Do you have a fever, pain , or abdominal swelling? no Pain Score  0 *  Have you tolerated food without any problems? yes  Have you been able to return to your normal activities? yes  Do you have any questions about your discharge instructions: Diet   no Medications  no Follow up visit  no  Do you have questions or concerns about your Care? no  Actions: * If pain score is 4 or above: No action needed, pain <4.  Pt does c/o a sore throat, "Like when I'm sick."  I told pt this is not unexpected but to try doing warm, salt water gargles, drink warm fluids today.  He states he was able to eat yesterday.  I told him that is he develops difficulty swallowing or cannot eat to call us back and understanding voiced.

## 2013-07-20 ENCOUNTER — Encounter: Payer: Self-pay | Admitting: Gastroenterology

## 2013-07-22 ENCOUNTER — Ambulatory Visit: Payer: BC Managed Care – PPO | Admitting: Family Medicine

## 2013-07-27 ENCOUNTER — Ambulatory Visit: Payer: BC Managed Care – PPO | Admitting: Family Medicine

## 2013-08-17 ENCOUNTER — Ambulatory Visit (INDEPENDENT_AMBULATORY_CARE_PROVIDER_SITE_OTHER): Payer: BC Managed Care – PPO | Admitting: Family Medicine

## 2013-08-17 ENCOUNTER — Encounter: Payer: Self-pay | Admitting: Family Medicine

## 2013-08-17 VITALS — BP 116/69 | HR 73 | Temp 97.9°F | Ht 72.0 in | Wt 313.0 lb

## 2013-08-17 DIAGNOSIS — K219 Gastro-esophageal reflux disease without esophagitis: Secondary | ICD-10-CM

## 2013-08-17 DIAGNOSIS — Z13220 Encounter for screening for lipoid disorders: Secondary | ICD-10-CM

## 2013-08-17 DIAGNOSIS — K2970 Gastritis, unspecified, without bleeding: Secondary | ICD-10-CM

## 2013-08-17 DIAGNOSIS — R1032 Left lower quadrant pain: Secondary | ICD-10-CM

## 2013-08-17 DIAGNOSIS — K2990 Gastroduodenitis, unspecified, without bleeding: Secondary | ICD-10-CM

## 2013-08-17 MED ORDER — LUBIPROSTONE 24 MCG PO CAPS: 24.0000 ug | ORAL_CAPSULE | Freq: Two times a day (BID) | ORAL | Status: DC

## 2013-08-17 NOTE — Progress Notes (Addendum)
   Subjective:    Patient ID: Adam Hamilton, male    DOB: 08/16/1970, 43 y.o.   MRN: [id number]  HPI Gi f/u for GERD. Here to discuss biopsy report.  Tried Linzess and it made him very nauseated and didn't do well with it.  Colonoscopy showed diverticuli. Still has to strain with stools.  Negative for celiac sprue.  He says after the colonoscopy and endoscopy they were going to test him for gluten intolerance for some blood work., But it was never ordered. He is still having to strain with bowel movements. It seems a little bit better since having had the clean out for the colonoscopy. He also travels a lot for his job and that makes a little bit more difficult as well. He does still take some over-the-counter omeprazole but only as needed if he gets some heartburn or reflux symptoms.  Review of Systems     Objective:   Physical Exam  Constitutional: He appears well-developed and well-nourished.  HENT:  Head: Normocephalic and atraumatic.  Abdominal: Soft. Bowel sounds are normal. He exhibits no distension and no mass. There is tenderness. There is no rebound and no guarding.  Tender in the LLQ  Skin: Skin is warm.  Psychiatric: He has a normal mood and affect. His behavior is normal.          Assessment & Plan:  Gatritis secondary to GERD - reviewed with him that the biopsy showed that he did have some gastritis at the GE junction. Recommend tx with PPI for 6-8 weeks and then can decrease to PRN for healing. Continue to work on reflux diet. If doesn't do well with the Amitiza 24 mcg then consider switching to MiraLax daily. May need to take it for a period time to get some consistency the stools. Followup in 6 weeks.  Constipation - will try the amitiza.  Discussed increased fiber in diet.  If doesn't do well with the Amitiza 24 mcg then consider switching to MiraLax daily. May need to take it for a period time to get some consistency the stools. Followup in 6 weeks.

## 2013-08-17 NOTE — Patient Instructions (Addendum)
High-Fiber Diet Fiber is found in fruits, vegetables, and grains. A high-fiber diet encourages the addition of more whole grains, legumes, fruits, and vegetables in your diet. The recommended amount of fiber for adult males is 38 g per day. For adult females, it is 25 g per day. Pregnant and lactating women should get 28 g of fiber per day. If you have a digestive or bowel problem, ask your caregiver for advice before adding high-fiber foods to your diet. Eat a variety of high-fiber foods instead of only a select few type of foods.  PURPOSE  To increase stool bulk.  To make bowel movements more regular to prevent constipation.  To lower cholesterol.  To prevent overeating. WHEN IS THIS DIET USED?  It may be used if you have constipation and hemorrhoids.  It may be used if you have uncomplicated diverticulosis (intestine condition) and irritable bowel syndrome.  It may be used if you need help with weight management.  It may be used if you want to add it to your diet as a protective measure against atherosclerosis, diabetes, and cancer. SOURCES OF FIBER  Whole-grain breads and cereals.  Fruits, such as apples, oranges, bananas, berries, prunes, and pears.  Vegetables, such as green peas, carrots, sweet potatoes, beets, broccoli, cabbage, spinach, and artichokes.  Legumes, such split peas, soy, lentils.  Almonds. FIBER CONTENT IN FOODS Starches and Grains / Dietary Fiber (g)  Cheerios, 1 cup / 3 g  Corn Flakes cereal, 1 cup / 0.7 g  Rice crispy treat cereal, 1 cup / 0.3 g  Instant oatmeal (cooked),  cup / 2 g  Frosted wheat cereal, 1 cup / 5.1 g  Brown, long-grain rice (cooked), 1 cup / 3.5 g  White, long-grain rice (cooked), 1 cup / 0.6 g  Enriched macaroni (cooked), 1 cup / 2.5 g Legumes / Dietary Fiber (g)  Baked beans (canned, plain, or vegetarian),  cup / 5.2 g  Kidney beans (canned),  cup / 6.8 g  Pinto beans (cooked),  cup / 5.5 g Breads and Crackers  / Dietary Fiber (g)  Plain or honey graham crackers, 2 squares / 0.7 g  Saltine crackers, 3 squares / 0.3 g  Plain, salted pretzels, 10 pieces / 1.8 g  Whole-wheat bread, 1 slice / 1.9 g  White bread, 1 slice / 0.7 g  Raisin bread, 1 slice / 1.2 g  Plain bagel, 3 oz / 2 g  Flour tortilla, 1 oz / 0.9 g  Corn tortilla, 1 small / 1.5 g  Hamburger or hotdog bun, 1 small / 0.9 g Fruits / Dietary Fiber (g)  Apple with skin, 1 medium / 4.4 g  Sweetened applesauce,  cup / 1.5 g  Banana,  medium / 1.5 g  Grapes, 10 grapes / 0.4 g  Orange, 1 small / 2.3 g  Raisin, 1.5 oz / 1.6 g  Melon, 1 cup / 1.4 g Vegetables / Dietary Fiber (g)  Green beans (canned),  cup / 1.3 g  Carrots (cooked),  cup / 2.3 g  Broccoli (cooked),  cup / 2.8 g  Peas (cooked),  cup / 4.4 g  Mashed potatoes,  cup / 1.6 g  Lettuce, 1 cup / 0.5 g  Corn (canned),  cup / 1.6 g  Tomato,  cup / 1.1 g Document Released: 05/27/2005 Document Revised: 11/26/2011 Document Reviewed: 08/29/2011 [facility] Patient Information 2014 [place], [place].     Take you omeprazole daily for 6-8 weeks.

## 2013-08-25 LAB — LIPID PANEL
CHOL/HDL RATIO: 4.8 ratio
CHOLESTEROL: 130 mg/dL (ref 0–200)
HDL: 27 mg/dL — AB (ref >39)
LDL Cholesterol: 62 mg/dL (ref 0–99)
Triglycerides: 204 mg/dL — ABNORMAL HIGH (ref <150)
VLDL: 41 mg/dL — ABNORMAL HIGH (ref 0–40)

## 2013-08-25 LAB — TISSUE TRANSGLUTAMINASE, IGA: Tissue Transglutaminase Ab, IgA: 8.2 U/mL (ref <20)

## 2013-09-27 ENCOUNTER — Emergency Department (HOSPITAL_BASED_OUTPATIENT_CLINIC_OR_DEPARTMENT_OTHER)
Admission: EM | Admit: 2013-09-27 | Discharge: 2013-09-27 | Disposition: A | Discharge status: Home / Self Care | Payer: BC Managed Care – PPO | Attending: Emergency Medicine | Admitting: Emergency Medicine

## 2013-09-27 ENCOUNTER — Encounter (HOSPITAL_BASED_OUTPATIENT_CLINIC_OR_DEPARTMENT_OTHER): Payer: Self-pay | Admitting: Emergency Medicine

## 2013-09-27 DIAGNOSIS — Z87442 Personal history of urinary calculi: Secondary | ICD-10-CM | POA: Insufficient documentation

## 2013-09-27 DIAGNOSIS — G510 Bell's palsy: Secondary | ICD-10-CM | POA: Insufficient documentation

## 2013-09-27 DIAGNOSIS — Z87891 Personal history of nicotine dependence: Secondary | ICD-10-CM | POA: Insufficient documentation

## 2013-09-27 DIAGNOSIS — Z79899 Other long term (current) drug therapy: Secondary | ICD-10-CM | POA: Insufficient documentation

## 2013-09-27 MED ORDER — PREDNISONE 20 MG PO TABS: 60.0000 mg | ORAL_TABLET | Freq: Every day | ORAL | Status: DC

## 2013-09-27 MED ORDER — VALACYCLOVIR HCL 1 G PO TABS: 1000.0000 mg | ORAL_TABLET | Freq: Three times a day (TID) | ORAL | Status: AC

## 2013-09-27 NOTE — ED Provider Notes (Signed)
CSN: [id number]     Arrival date & time 09/27/13  1045 History   First MD Initiated Contact with Patient 09/27/13 1047     Chief Complaint  Patient presents with  . Facial Droop     (Consider location/radiation/quality/duration/timing/severity/associated sxs/prior Treatment) HPI Pt reports yesterday he noticed his L eye was dry ?blurry from contact lens, but otherwise feeling well. Woke up this morning with L facial droop, difficulty spitting, metallic taste in mouth and difficulty speaking. No arm or leg weakness, no difficulty walking.   Past Medical History  Diagnosis Date  . Kidney stones   . Postural dizziness   . OSA (obstructive sleep apnea)    Past Surgical History  Procedure Laterality Date  . Knee surgery  2011  . Tonsillectomy and adenoidectomy      For snoring.    Family History  Problem Relation Age of Onset  . Diabetes Mother   . Stroke Mother 67  . Hypertension Mother   . Colon polyps Mother   . Heart attack Neg Hx   . Hyperlipidemia Neg Hx   . Sudden death Neg Hx   . Stomach cancer Father 54  . Lymphoma Paternal Grandfather 68  . Prostate cancer Paternal Grandfather    History  Substance Use Topics  . Smoking status: Former Smoker -- 0.50 packs/day for 20 years    Quit date: 01/13/2012  . Smokeless tobacco: Never Used  . Alcohol Use: 0.5 - 2.5 oz/week    1-5 drink(s) per week     Comment: Rarely    Review of Systems All other systems reviewed and are negative except as noted in HPI.     Allergies  Linzess  Home Medications   Prior to Admission medications   Medication Sig Start Date End Date Taking? Authorizing Provider  hyoscyamine (LEVSIN SL) 0.125 MG SL tablet Take one tablet every 6 hours as needed for abdominal pain and cramping 07/06/13   Janett Billow D. Zehr, PA-C  lubiprostone (AMITIZA) 24 MCG capsule Take 1 capsule (24 mcg total) by mouth 2 (two) times daily with a meal. 08/17/13   Hali Marry, MD  magnesium citrate SOLN Take 1  Bottle by mouth once.    Historical Provider, MD  omeprazole (PRILOSEC) 20 MG capsule Take 20 mg by mouth daily.    Historical Provider, MD  ondansetron (ZOFRAN) 4 MG tablet Take 1 tablet (4 mg total) by mouth every 4 (four) hours as needed for nausea or vomiting. 07/06/13   Laban Emperor. Zehr, PA-C   BP 140/75  Pulse 92  Temp(Src) 99.2 F (37.3 C) (Oral)  Resp 18  Ht 6\' 3"  (1.905 m)  Wt 290 lb (131.543 kg)  BMI 36.25 kg/m2  SpO2 98% Physical Exam  Nursing note and vitals reviewed. Constitutional: He is oriented to person, place, and time. He appears well-developed and well-nourished.  HENT:  Head: Normocephalic and atraumatic.  Eyes: EOM are normal. Pupils are equal, round, and reactive to light.  Neck: Normal range of motion. Neck supple.  Cardiovascular: Normal rate, normal heart sounds and intact distal pulses.   Pulmonary/Chest: Effort normal and breath sounds normal.  Abdominal: Bowel sounds are normal. He exhibits no distension. There is no tenderness.  Musculoskeletal: Normal range of motion. He exhibits no edema and no tenderness.  Neurological: He is alert and oriented to person, place, and time. He has normal strength and normal reflexes. A cranial nerve deficit (partial peripheral L 7th nerve palsy) is present. No sensory deficit. Coordination  normal.  Skin: Skin is warm and dry. No rash noted.  Psychiatric: He has a normal mood and affect.    ED Course  Procedures (including critical care time) Labs Review Labs Reviewed - No data to display  Imaging Review No results found.   EKG Interpretation None      MDM   Final diagnoses:  Bell's palsy   Pt with symptoms consistent with partial Bell's Palsy, otherwise normal Neuro exam. Discussed findings, diagnosis, treatment and prognosis with the patient including incomplete recovery to baseline. Prednisone, Antivirals and PCP followup.     Mairim Bade B. Karle Starch, MD 09/27/13 1121

## 2013-09-27 NOTE — Discharge Instructions (Signed)
Bell's Palsy  Bell's palsy is a condition in which the muscles on one side of the face cannot move (paralysis). This is because the nerves in the face are paralyzed. It is most often thought to be caused by a virus. The virus causes swelling of the nerve that controls movement on one side of the face. The nerve travels through a tight space surrounded by bone. When the nerve swells, it can be compressed by the bone. This results in damage to the protective covering around the nerve. This damage interferes with how the nerve communicates with the muscles of the face. As a result, it can cause weakness or paralysis of the facial muscles.   Injury (trauma), tumor, and surgery may cause Bell's palsy, but most of the time the cause is unknown. It is a relatively common condition. It starts suddenly (abrupt onset) with the paralysis usually ending within 2 days. Bell's palsy is not dangerous. But because the eye does not close properly, you may need care to keep the eye from getting dry. This can include splinting (to keep the eye shut) or moistening with artificial tears. Bell's palsy very seldom occurs on both sides of the face at the same time.  SYMPTOMS    Eyebrow sagging.   Drooping of the eyelid and corner of the mouth.   Inability to close one eye.   Loss of taste on the front of the tongue.   Sensitivity to loud noises.  TREATMENT   The treatment is usually non-surgical. If the patient is seen within the first 24 to 48 hours, a short course of steroids may be prescribed, in an attempt to shorten the length of the condition. Antiviral medicines may also be used with the steroids, but it is unclear if they are helpful.   You will need to protect your eye, if you cannot close it. The cornea (clear covering over your eye) will become dry and can be damaged. Artificial tears can be used to keep your eye moist. Glasses or an eye patch should be worn to protect your eye.  PROGNOSIS   Recovery is variable, ranging  from days to months. Although the problem usually goes away completely (about 80% of cases resolve), predicting the outcome is impossible. Most people improve within 3 weeks of when the symptoms began. Improvement may continue for 3 to 6 months. A small number of people have moderate to severe weakness that is permanent.   HOME CARE INSTRUCTIONS    If your caregiver prescribed medication to reduce swelling in the nerve, use as directed. Do not stop taking the medication unless directed by your caregiver.   Use moisturizing eye drops as needed to prevent drying of your eye, as directed by your caregiver.   Protect your eye, as directed by your caregiver.   Use facial massage and exercises, as directed by your caregiver.   Perform your normal activities, and get your normal rest.  SEEK IMMEDIATE MEDICAL CARE IF:    There is pain, redness or irritation in the eye.   You or your child has an oral temperature above 102 F (38.9 C), not controlled by medicine.  MAKE SURE YOU:    Understand these instructions.   Will watch your condition.   Will get help right away if you are not doing well or get worse.  Document Released: 05/27/2005 Document Revised: 08/19/2011 Document Reviewed: 06/05/2009  [employer] Patient Information 2014 [employer].

## 2013-09-27 NOTE — ED Notes (Signed)
Pt reports that he had blurry vision yesterday in his (L) eye.  States that he went to bed feeling normal.  States that when he woke up and tried to spit he was unable.  Pt unable to raise (L) eyebrow as much as (R).  Pt also noted to have a droop when smiling. Speech slightly slurred.

## 2013-10-04 ENCOUNTER — Ambulatory Visit (INDEPENDENT_AMBULATORY_CARE_PROVIDER_SITE_OTHER): Payer: BC Managed Care – PPO | Admitting: Family Medicine

## 2013-10-04 ENCOUNTER — Encounter: Payer: Self-pay | Admitting: Family Medicine

## 2013-10-04 VITALS — BP 120/82 | HR 74 | Ht 78.6 in | Wt 309.0 lb

## 2013-10-04 DIAGNOSIS — G510 Bell's palsy: Secondary | ICD-10-CM

## 2013-10-04 NOTE — Progress Notes (Signed)
   Subjective:    Patient ID: Adam Hamilton, male    DOB: [birth year], 43 y.o.   MRN: [id number]  HPI Seen 7 days ago for Bells' palsy on the left side of his face at the emergency department. Completing a course of prednisone and dicyclomine.no URI around that time.  Left ear pain and headaches over the last week. Has been able to work frome home. He did take an Aleve last night for headache and it worked well.   Review of Systems     Objective:   Physical Exam  Constitutional: He is oriented to person, place, and time. He appears well-developed and well-nourished.  HENT:  Head: Normocephalic and atraumatic.  Right Ear: External ear normal.  Left Ear: External ear normal.  Nose: Nose normal.  Mouth/Throat: Oropharynx is clear and moist.  Unable to rasie the left eyebrow.  Unable to smile on the left.   Eyes: Conjunctivae and EOM are normal. Pupils are equal, round, and reactive to light.  Neck: Neck supple. No thyromegaly present.  Neurological: He is alert and oriented to person, place, and time.  Skin: Skin is warm and dry.  Psychiatric: He has a normal mood and affect. His behavior is normal.          Assessment & Plan:  Bells palsy-unfortunately he has not had any recovery yet. It's only been 7 days then. Explained to him that 75% of people will improve by week 3. He completed the course of prednisone has one more valacyclovir complete. Provided additional handout. 3 weeks he is moderately 50% improved and encouraged him to follow back up. He can continue to use Aleve as needed for headaches and pain relief. He does seem to be helping.

## 2014-01-27 ENCOUNTER — Encounter (HOSPITAL_BASED_OUTPATIENT_CLINIC_OR_DEPARTMENT_OTHER): Payer: Self-pay | Admitting: Emergency Medicine

## 2014-01-27 ENCOUNTER — Emergency Department (HOSPITAL_BASED_OUTPATIENT_CLINIC_OR_DEPARTMENT_OTHER)
Admission: EM | Admit: 2014-01-27 | Discharge: 2014-01-27 | Disposition: A | Discharge status: Home / Self Care | Payer: BC Managed Care – PPO | Attending: Emergency Medicine | Admitting: Emergency Medicine

## 2014-01-27 ENCOUNTER — Emergency Department (HOSPITAL_BASED_OUTPATIENT_CLINIC_OR_DEPARTMENT_OTHER): Payer: BC Managed Care – PPO

## 2014-01-27 DIAGNOSIS — Z79899 Other long term (current) drug therapy: Secondary | ICD-10-CM | POA: Diagnosis not present

## 2014-01-27 DIAGNOSIS — Z87891 Personal history of nicotine dependence: Secondary | ICD-10-CM | POA: Diagnosis not present

## 2014-01-27 DIAGNOSIS — R42 Dizziness and giddiness: Secondary | ICD-10-CM | POA: Insufficient documentation

## 2014-01-27 DIAGNOSIS — Z8669 Personal history of other diseases of the nervous system and sense organs: Secondary | ICD-10-CM | POA: Diagnosis not present

## 2014-01-27 DIAGNOSIS — R079 Chest pain, unspecified: Secondary | ICD-10-CM

## 2014-01-27 DIAGNOSIS — R0602 Shortness of breath: Secondary | ICD-10-CM | POA: Diagnosis not present

## 2014-01-27 DIAGNOSIS — I951 Orthostatic hypotension: Secondary | ICD-10-CM

## 2014-01-27 DIAGNOSIS — Z87442 Personal history of urinary calculi: Secondary | ICD-10-CM | POA: Diagnosis not present

## 2014-01-27 LAB — COMPREHENSIVE METABOLIC PANEL
ALT: 53 U/L (ref 0–53)
ANION GAP: 14 (ref 5–15)
AST: 27 U/L (ref 0–37)
Albumin: 4.3 g/dL (ref 3.5–5.2)
Alkaline Phosphatase: 68 U/L (ref 39–117)
BILIRUBIN TOTAL: 0.4 mg/dL (ref 0.3–1.2)
BUN: 13 mg/dL (ref 6–23)
CHLORIDE: 100 meq/L (ref 96–112)
CO2: 26 meq/L (ref 19–32)
CREATININE: 1.1 mg/dL (ref 0.50–1.35)
Calcium: 10.3 mg/dL (ref 8.4–10.5)
GFR calc Af Amer: >90 mL/min (ref >90)
GFR, EST NON AFRICAN AMERICAN: 81 mL/min — AB (ref >90)
GLUCOSE: 103 mg/dL — AB (ref 70–99)
POTASSIUM: 3.7 meq/L (ref 3.7–5.3)
Sodium: 140 mEq/L (ref 137–147)
Total Protein: 8.1 g/dL (ref 6.0–8.3)

## 2014-01-27 LAB — CBC
HEMATOCRIT: 44.4 % (ref 39.0–52.0)
HEMOGLOBIN: 15.3 g/dL (ref 13.0–17.0)
MCH: 30.5 pg (ref 26.0–34.0)
MCHC: 34.5 g/dL (ref 30.0–36.0)
MCV: 88.6 fL (ref 78.0–100.0)
Platelets: 258 10*3/uL (ref 150–400)
RBC: 5.01 MIL/uL (ref 4.22–5.81)
RDW: 13.1 % (ref 11.5–15.5)
WBC: 12.1 10*3/uL — AB (ref 4.0–10.5)

## 2014-01-27 LAB — TROPONIN I

## 2014-01-27 MED ORDER — SODIUM CHLORIDE 0.9 % IV BOLUS (SEPSIS)
1000.0000 mL | Freq: Once | INTRAVENOUS | Status: AC
  Administered 2014-01-27: 1000 mL via INTRAVENOUS

## 2014-01-27 NOTE — ED Provider Notes (Signed)
CSN: [id number]     Arrival date & time 01/27/14  1809 History  This chart was scribed for Adam Arthurs, MD by Einar Pheasant, ED Scribe. This patient was seen in room MH02/MH02 and the patient's care was started at 6:20 PM.    Chief Complaint  Patient presents with  . Chest Pain   The history is provided by the patient. No language interpreter was used.   HPI Comments: Adam Hamilton is a 43 y.o. male with a hx of hydrostatic hypertension presents to the Emergency Department complaining of chest pain that started about 30 minutes PTA. Pt states that he was in good health and about to leave his office to go home. However, when he got up to leave he started experiencing a "burning/pressure" sensation to the left side of his chest. Following the onset of the chest pain he started to experience ssociated dizziness and SOB.  He states that the chest pain lasted about 15 seconds then went away on its own, but the dizziness and SOB continued to persist. He states that he thought his symptoms would subside on their own so he started to drive home, however, pt had to turn back because he was feeling okay. Denies any fever, chill, family history of heart disease, smoking nicotine (He does smoke an electronic cigarette), or weakness.    Past Medical History  Diagnosis Date  . Kidney stones   . Postural dizziness   . OSA (obstructive sleep apnea)    Past Surgical History  Procedure Laterality Date  . Knee surgery  2011  . Tonsillectomy and adenoidectomy      For snoring.    Family History  Problem Relation Age of Onset  . Diabetes Mother   . Stroke Mother 64  . Hypertension Mother   . Colon polyps Mother   . Heart attack Neg Hx   . Hyperlipidemia Neg Hx   . Sudden death Neg Hx   . Stomach cancer Father 33  . Lymphoma Paternal Grandfather 39  . Prostate cancer Paternal Grandfather    History  Substance Use Topics  . Smoking status: Former Smoker -- 0.50 packs/day for 20 years    Quit  date: 01/13/2012  . Smokeless tobacco: Never Used  . Alcohol Use: 0.5 - 2.5 oz/week    1-5 drink(s) per week     Comment: Rarely    Review of Systems  Respiratory: Positive for shortness of breath.   Cardiovascular: Positive for chest pain.  Neurological: Positive for dizziness. Negative for weakness.  All other systems reviewed and are negative.  Allergies  Linzess  Home Medications   Prior to Admission medications   Medication Sig Start Date End Date Taking? Authorizing Provider  omeprazole (PRILOSEC) 20 MG capsule Take 20 mg by mouth daily.    Historical Provider, MD  ondansetron (ZOFRAN) 4 MG tablet Take 1 tablet (4 mg total) by mouth every 4 (four) hours as needed for nausea or vomiting. 07/06/13   Laban Emperor. Zehr, PA-C   Triage vitals: BP 155/95  Pulse 91  Temp(Src) 97.7 F (36.5 C) (Oral)  Resp 16  Ht 6\' 4"  (1.93 m)  Wt 292 lb (132.45 kg)  BMI 35.56 kg/m2  SpO2 98%  Physical Exam  Nursing note and vitals reviewed. Constitutional: He is oriented to person, place, and time. He appears well-developed and well-nourished. No distress.  HENT:  Head: Normocephalic and atraumatic.  Eyes: Conjunctivae and EOM are normal.  Neck: Neck supple.  Cardiovascular: Normal  rate.   Pulmonary/Chest: Effort normal. No respiratory distress. He has no wheezes. He has no rales.  Abdominal: Soft. Bowel sounds are normal. There is no tenderness.  Musculoskeletal: Normal range of motion. He exhibits no edema.  No pedal edema.  Neurological: He is alert and oriented to person, place, and time.  Skin: Skin is warm and dry.  Psychiatric: He has a normal mood and affect. His behavior is normal.    ED Course  Procedures (including critical care time)  DIAGNOSTIC STUDIES: Oxygen Saturation is 98% on RA, normal by my interpretation.    COORDINATION OF CARE: 6:24 PM- Will order CBC, CMP, and Troponin. Pt advised of plan for treatment and pt agrees.  Medications  sodium chloride 0.9 %  bolus 1,000 mL (0 mLs Intravenous Stopped 01/27/14 2031)    Labs Review Labs Reviewed  CBC - Abnormal; Notable for the following:    WBC 12.1 (*)    All other components within normal limits  COMPREHENSIVE METABOLIC PANEL - Abnormal; Notable for the following:    Glucose, Bld 103 (*)    GFR calc non Af Amer 81 (*)    All other components within normal limits  TROPONIN I  TROPONIN I    Imaging Review Dg Chest 2 View  01/27/2014   CLINICAL DATA:  Chest pain.  EXAM: CHEST  2 VIEW  COMPARISON:  None.  FINDINGS: The cardiac silhouette, mediastinal and hilar contours are normal. The lungs are clear. No pleural effusion. The bony thorax is intact.  IMPRESSION: No acute cardiopulmonary findings.   Electronically Signed   By: Kalman Jewels M.D.   On: 01/27/2014 19:19   EKG Interpretation Date/Time:  Thursday January 27 2014 18:26:51 EDT Ventricular Rate:  86 PR Interval:  162 QRS Duration: 80 QT Interval:  364 QTC Calculation: 435 R Axis:   28 Text Interpretation:  Normal sinus rhythm Normal ECG No previous ECGs  available Confirmed by Elba Dendinger  MD, Alveda Vanhorne ([id number]) on 01/27/2014 6:33:25 PM      MDM   Final diagnoses:  None   Adam Hamilton is a 43 y.o. male here with dizziness, chest pain. Patient orthostatic initially. Given 1L NS and was not longer orthostatic. Trop neg x 2, low risk for ACS. I think she may have POTS. I recommend increase salt in diet, stay hydrated, and possibly be referred to [facility] for further workup.    I personally performed the services described in this documentation, which was scribed in my presence. The recorded information has been reviewed and is accurate.    Adam Arthurs, MD 01/27/14 2113

## 2014-01-27 NOTE — ED Notes (Addendum)
Pt c/o CP with SOB/ dizziness sitting at desk today

## 2014-01-27 NOTE — Discharge Instructions (Signed)
Stay hydrated. Increase salt intake.   You have postural hypotension. You may need further workup at [facility] for POTS syndrome.   Return to ER if you are passing out, chest pain, shortness of breath.

## 2014-02-03 ENCOUNTER — Ambulatory Visit (INDEPENDENT_AMBULATORY_CARE_PROVIDER_SITE_OTHER): Payer: BC Managed Care – PPO | Admitting: Family Medicine

## 2014-02-03 ENCOUNTER — Encounter: Payer: Self-pay | Admitting: Family Medicine

## 2014-02-03 VITALS — BP 128/74 | HR 77 | Ht 76.0 in | Wt 320.0 lb

## 2014-02-03 DIAGNOSIS — I959 Hypotension, unspecified: Secondary | ICD-10-CM

## 2014-02-03 NOTE — Progress Notes (Signed)
Subjective:    Patient ID: Adam Hamilton, male    DOB: 04-08-1971, 43 y.o.   MRN: [id number]  HPI Patient was seen in the emergency department 7 days ago for chest pain and lightheadedness. He ruled out for any cardiac problems. But he was orthostatic upon presentation. After a liter of normal saline his orthostatics were normal. He initially increased his fluids but recently decided to cut back to just drinking fluids with his meals and noticed that his dizziness was returning.  CXR was normal. Had similar episodes in 2012 and saw cardiology at that time who felt it was orthostatic as well.  Still has pulled muscle. Says not feels it every time he stands. Feels like it has been worse recently.   I did review his records from the emergency department as well as his previous visits with Dr. Kirk Ruths in 2012. Review of Systems  BP 128/74  Pulse 77  Ht 6\' 4"  (1.93 m)  Wt 320 lb (145.151 kg)  BMI 38.97 kg/m2    Allergies  Allergen Reactions  . Linzess [Linaclotide] Nausea Only    Past Medical History  Diagnosis Date  . Kidney stones   . Postural dizziness   . OSA (obstructive sleep apnea)     Past Surgical History  Procedure Laterality Date  . Knee surgery  2011  . Tonsillectomy and adenoidectomy      For snoring.     History   Social History  . Marital Status: Married    Spouse Name: Angie    Number of Children: 3  . Years of Education: 2   Occupational History  . Research officer, [employer]   Social History Main Topics  . Smoking status: Former Smoker -- 0.50 packs/day for 20 years    Quit date: 01/13/2012  . Smokeless tobacco: Never Used  . Alcohol Use: 0.5 - 2.5 oz/week    1-5 drink(s) per week     Comment: Rarely  . Drug Use: No  . Sexual Activity: Yes   Other Topics Concern  . Not on file   Social History Narrative   2-3 caffein drinks per day. Some regular exercise.     Family History  Problem Relation Age  of Onset  . Diabetes Mother   . Stroke Mother 33  . Hypertension Mother   . Colon polyps Mother   . Heart attack Neg Hx   . Hyperlipidemia Neg Hx   . Sudden death Neg Hx   . Stomach cancer Father 24  . Lymphoma Paternal Grandfather 58  . Prostate cancer Paternal Grandfather     Outpatient Encounter Prescriptions as of 02/03/2014  Medication Sig  . omeprazole (PRILOSEC) 20 MG capsule Take 20 mg by mouth daily.  . [DISCONTINUED] ondansetron (ZOFRAN) 4 MG tablet Take 1 tablet (4 mg total) by mouth every 4 (four) hours as needed for nausea or vomiting.          Objective:   Physical Exam  Constitutional: He is oriented to person, place, and time. He appears well-developed and well-nourished.  HENT:  Head: Normocephalic and atraumatic.  Neck: Neck supple. No thyromegaly present.  Cardiovascular: Normal rate, regular rhythm and normal heart sounds.   Pulmonary/Chest: Effort normal and breath sounds normal.  Abdominal: Soft. Bowel sounds are normal. He exhibits no distension.  Musculoskeletal: He exhibits no edema.  Neurological: He is alert and oriented to person, place, and time.  Skin: Skin is warm and  dry.  Psychiatric: He has a normal mood and affect. His behavior is normal.          Assessment & Plan:  Orthostatic hypotension - will place referral to [facility]. There is a cardiologist who specializes in this. He wants to determine if there may be an underlying cause that might be able to be diagnosed and might change his treatment. For now strongly encouraged him to continue to work on increasing his fluids on a daily basis and okay to increase salt intake. Today he is doing well. Blood pressure looks great and he is not orthostatic.  Time spent 25 min, >50% spent counseling about hypotension.

## 2014-03-21 DIAGNOSIS — E669 Obesity, unspecified: Secondary | ICD-10-CM | POA: Insufficient documentation

## 2014-08-01 ENCOUNTER — Ambulatory Visit (INDEPENDENT_AMBULATORY_CARE_PROVIDER_SITE_OTHER): Payer: BLUE CROSS/BLUE SHIELD

## 2014-08-01 ENCOUNTER — Encounter: Payer: Self-pay | Admitting: Physician Assistant

## 2014-08-01 ENCOUNTER — Ambulatory Visit (INDEPENDENT_AMBULATORY_CARE_PROVIDER_SITE_OTHER): Payer: BLUE CROSS/BLUE SHIELD | Admitting: Physician Assistant

## 2014-08-01 VITALS — BP 126/73 | HR 78 | Temp 98.1°F | Ht 76.0 in | Wt 329.0 lb

## 2014-08-01 DIAGNOSIS — R1033 Periumbilical pain: Secondary | ICD-10-CM

## 2014-08-01 DIAGNOSIS — K5900 Constipation, unspecified: Secondary | ICD-10-CM | POA: Diagnosis not present

## 2014-08-01 DIAGNOSIS — K429 Umbilical hernia without obstruction or gangrene: Secondary | ICD-10-CM

## 2014-08-01 DIAGNOSIS — R1084 Generalized abdominal pain: Secondary | ICD-10-CM

## 2014-08-01 DIAGNOSIS — R319 Hematuria, unspecified: Secondary | ICD-10-CM | POA: Diagnosis not present

## 2014-08-01 LAB — COMPLETE METABOLIC PANEL WITH GFR
ALK PHOS: 74 U/L (ref 39–117)
ALT: 41 U/L (ref 0–53)
AST: 26 U/L (ref 0–37)
Albumin: 4.6 g/dL (ref 3.5–5.2)
BUN: 13 mg/dL (ref 6–23)
CALCIUM: 10.2 mg/dL (ref 8.4–10.5)
CO2: 30 mEq/L (ref 19–32)
Chloride: 100 mEq/L (ref 96–112)
Creat: 0.89 mg/dL (ref 0.50–1.35)
GFR, Est Non African American: >89 mL/min
Glucose, Bld: 139 mg/dL — ABNORMAL HIGH (ref 70–99)
POTASSIUM: 4.4 meq/L (ref 3.5–5.3)
SODIUM: 138 meq/L (ref 135–145)
Total Bilirubin: 0.3 mg/dL (ref 0.2–1.2)
Total Protein: 7.2 g/dL (ref 6.0–8.3)

## 2014-08-01 LAB — CBC WITH DIFFERENTIAL/PLATELET
BASOS ABS: 0.1 10*3/uL (ref 0.0–0.1)
BASOS PCT: 1 % (ref 0–1)
EOS ABS: 0.4 10*3/uL (ref 0.0–0.7)
Eosinophils Relative: 3 % (ref 0–5)
HCT: 45.1 % (ref 39.0–52.0)
Hemoglobin: 14.9 g/dL (ref 13.0–17.0)
Lymphocytes Relative: 32 % (ref 12–46)
Lymphs Abs: 3.9 10*3/uL (ref 0.7–4.0)
MCH: 28.8 pg (ref 26.0–34.0)
MCHC: 33 g/dL (ref 30.0–36.0)
MCV: 87.3 fL (ref 78.0–100.0)
MONOS PCT: 10 % (ref 3–12)
Monocytes Absolute: 1.2 10*3/uL — ABNORMAL HIGH (ref 0.1–1.0)
Neutro Abs: 6.6 10*3/uL (ref 1.7–7.7)
Neutrophils Relative %: 54 % (ref 43–77)
PLATELETS: 283 10*3/uL (ref 150–400)
RBC: 5.17 MIL/uL (ref 4.22–5.81)
RDW: 13.5 % (ref 11.5–15.5)
WBC: 12.2 10*3/uL — AB (ref 4.0–10.5)

## 2014-08-01 LAB — POCT URINALYSIS DIPSTICK
BILIRUBIN UA: NEGATIVE
Glucose, UA: NEGATIVE
Ketones, UA: NEGATIVE
Leukocytes, UA: NEGATIVE
Nitrite, UA: NEGATIVE
PH UA: 7
Protein, UA: NEGATIVE
SPEC GRAV UA: 1.01
Urobilinogen, UA: 0.2

## 2014-08-01 NOTE — Progress Notes (Signed)
   Subjective:    Patient ID: Adam Hamilton, male    DOB: November 30, 1970, 44 y.o.   MRN: [id number]  HPI  Patient is a 44 year old male who presents to the clinic with. Umbilical pain since Friday, approximately 3 days ago. Pain does not seem to be worsening but staying the same. Certainly worse with bending and movement. It can get as bad as 7 out of 10. He does feel like he's noticed a slight bulge at his bellybutton level. There is pain to push in his bellybutton. Pain is best when he is laid curled up. There is also pain when he urinates that seems to radiate up into his bellybutton. He denies any back or flank pain. He denies any visible blood in urine. He has not had any diarrhea. He has a history of struggling with constipation. He has tried Linzess in the past with no relief. MiraLAX does not seem to work either. Last bowel movement was yesterday morning. Pain did seem to ease up after a bowel movement. He denies any fever, chills, nausea.     Review of Systems  All other systems reviewed and are negative.      Objective:   Physical Exam  Constitutional: He is oriented to person, place, and time. He appears well-developed and well-nourished.  HENT:  Head: Normocephalic and atraumatic.  Cardiovascular: Normal rate, regular rhythm and normal heart sounds.   Pulmonary/Chest: Effort normal and breath sounds normal.  No CVa tenderness  Abdominal:  Very slight reducible bulge in the umbilical area that could represent a small umbilical hernia.  Some tenderness to palpation over lower left and right quadrants.  Neurological: He is alert and oriented to person, place, and time.  Skin: Skin is dry.  Psychiatric: He has a normal mood and affect. His behavior is normal.          Assessment & Plan:    Peri- Umbilical pain/umbicial hernia/constipation- UA positive for only trace blood. Do not think stones or UTI. Will culture.  I don't think pain coming from mild hernia. Hx of  constipation. Will get abd xray. Due to hx go home and drink mag citrate to have a bowel movement. Will get cbc and cmp to look for any acute WBC changes. Peri-umbicical pain can be first sign of appendicitis. Will evaluate with WBC. Discussed signs and symptoms of appendicitis. Call if pain worsening.

## 2014-08-03 LAB — URINE CULTURE

## 2014-09-29 ENCOUNTER — Emergency Department (INDEPENDENT_AMBULATORY_CARE_PROVIDER_SITE_OTHER): Payer: BLUE CROSS/BLUE SHIELD

## 2014-09-29 ENCOUNTER — Emergency Department (INDEPENDENT_AMBULATORY_CARE_PROVIDER_SITE_OTHER)
Admission: EM | Admit: 2014-09-29 | Discharge: 2014-09-29 | Disposition: A | Discharge status: Home / Self Care | Payer: BLUE CROSS/BLUE SHIELD | Source: Home / Self Care | Attending: Emergency Medicine | Admitting: Emergency Medicine

## 2014-09-29 ENCOUNTER — Encounter: Payer: Self-pay | Admitting: Emergency Medicine

## 2014-09-29 DIAGNOSIS — M545 Low back pain: Secondary | ICD-10-CM

## 2014-09-29 DIAGNOSIS — M5416 Radiculopathy, lumbar region: Secondary | ICD-10-CM

## 2014-09-29 DIAGNOSIS — M5417 Radiculopathy, lumbosacral region: Secondary | ICD-10-CM | POA: Diagnosis not present

## 2014-09-29 MED ORDER — METHYLPREDNISOLONE ACETATE 80 MG/ML IJ SUSP
80.0000 mg | Freq: Once | INTRAMUSCULAR | Status: AC
  Administered 2014-09-29: 80 mg via INTRAMUSCULAR

## 2014-09-29 MED ORDER — CYCLOBENZAPRINE HCL 5 MG PO TABS
ORAL_TABLET | ORAL | Status: DC
Start: 2014-09-29 — End: 2015-07-20

## 2014-09-29 MED ORDER — PREDNISONE 20 MG PO TABS: 20.0000 mg | ORAL_TABLET | Freq: Two times a day (BID) | ORAL | Status: DC

## 2014-09-29 MED ORDER — IBUPROFEN 200 MG PO TABS: ORAL_TABLET | ORAL | Status: DC

## 2014-09-29 MED ORDER — KETOROLAC TROMETHAMINE 60 MG/2ML IM SOLN
60.0000 mg | Freq: Once | INTRAMUSCULAR | Status: AC
  Administered 2014-09-29: 60 mg via INTRAMUSCULAR

## 2014-09-29 NOTE — ED Provider Notes (Signed)
CSN: [id number]     Arrival date & time 09/29/14  1658 History   First MD Initiated Contact with Patient 09/29/14 [facility] Chief Complaint  Patient presents with  . Tailbone Pain   low back pain  HPI Patient presents  with  lower back pain getting worse over 2 1/2 weeks after a white water rafting trip. He rates pain a 10/10 with movement and a 3/10 without movement. Pain is sharp. Associated with paralumbar muscle spasm. Worse to sit or get up from sitting. Only positive neurologic symptoms :numbness dorsum of right great toe. And mild numbness right anterior thigh. He's tried Aleve 600 mg twice a day without significant improvement. Denies focal weakness, urinary incontinence, bowel or bladder dysfunction, hematuria, dysuria, or any urinary symptoms. No fever, chills, abdominal pain, nausea, vomiting. Denies chronic back problems. No history of back surgery. After further questioning, he recalls that at age 44 he had some type of back injury without fracture or neurologic symptoms, that eventually resolved without sequelae. He states that prior imaging of other musculoskeletal problems have incidentally found some arthritis.  Past Medical History  Diagnosis Date  . Kidney stones   . Postural dizziness   . OSA (obstructive sleep apnea)    Past Surgical History  Procedure Laterality Date  . Knee surgery  2011  . Tonsillectomy and adenoidectomy      For snoring.    Family History  Problem Relation Age of Onset  . Diabetes Mother   . Stroke Mother 23  . Hypertension Mother   . Colon polyps Mother   . Heart attack Neg Hx   . Hyperlipidemia Neg Hx   . Sudden death Neg Hx   . Stomach cancer Father 7  . Lymphoma Paternal Grandfather 19  . Prostate cancer Paternal Grandfather    History  Substance Use Topics  . Smoking status: Former Smoker -- 0.50 packs/day for 20 years    Quit date: 01/13/2012  . Smokeless tobacco: Never Used  . Alcohol Use: 0.5 - 2.5  oz/week    1-5 drink(s) per week     Comment: Rarely    Review of Systems  All other systems reviewed and are negative.   Allergies  Linzess  Home Medications   Prior to Admission medications   Medication Sig Start Date End Date Taking? Authorizing Provider  cyclobenzaprine (FLEXERIL) 5 MG tablet Take 1 or 2 every 8 hours as needed for muscle relaxant. Caution: May cause drowsiness. 09/29/14   Jacqulyn Cane, MD  ibuprofen (ADVIL,MOTRIN) 200 MG tablet Take three tablets ( 600 milligrams total) every 6 with food as needed for pain. 09/29/14   Jacqulyn Cane, MD  omeprazole (PRILOSEC) 20 MG capsule Take 20 mg by mouth daily.    Historical Provider, MD  predniSONE (DELTASONE) 20 MG tablet Take 1 tablet (20 mg total) by mouth 2 (two) times daily with a meal. X 5 days.-- Start taking Friday morning 09/29/14   Jacqulyn Cane, MD   BP 137/82 mmHg  Pulse 73  Temp(Src) 97.7 F (36.5 C) (Oral)  Resp 16  Ht 6' 3.5" (1.918 m)  Wt 329 lb 12 oz (149.574 kg)  BMI 40.66 kg/m2  SpO2 100% Physical Exam  Constitutional: He is oriented to person, place, and time. He appears well-developed and well-nourished. He is cooperative.  Non-toxic appearance. He appears distressed (Appears uncomfortable from low back pain.).  HENT:  Head: Normocephalic and atraumatic.  Mouth/Throat: Oropharynx is clear and moist.  Eyes: EOM are normal. Pupils are equal, round, and reactive to light. No scleral icterus.  Neck: Neck supple.  Cardiovascular: Regular rhythm and normal heart sounds.   Pulmonary/Chest: Effort normal and breath sounds normal. No respiratory distress. He has no wheezes. He has no rales. He exhibits no tenderness.  Abdominal: Soft. There is no tenderness.  Musculoskeletal:       Right hip: Normal.       Left hip: Normal.       Cervical back: He exhibits no tenderness.       Thoracic back: He exhibits no tenderness.       Lumbar back: He exhibits decreased range of motion, tenderness, bony tenderness  and spasm. He exhibits no swelling, no edema, no deformity, no laceration and normal pulse.  Negative Right straight leg-raise test. Negative Left straight leg-raise test.  Negative Right Saralyn Pilar test. Negative Left Saralyn Pilar test.    Neurological: He is alert and oriented to person, place, and time. He has normal strength. He displays no atrophy, no tremor and normal reflexes. A sensory deficit (Decreased sensation to light touch, right big toe) is present. No cranial nerve deficit. He exhibits normal muscle tone. Gait normal.  Reflex Scores:      Patellar reflexes are 2+ on the right side and 2+ on the left side.      Achilles reflexes are 1+ on the right side and 1+ on the left side. Skin: Skin is warm, dry and intact. No lesion and no rash noted.  Psychiatric: He has a normal mood and affect.  Nursing note and vitals reviewed.   UC Course 545 PM Discussed with patient.  X-ray lumbar spine ordered   Procedures  6:21 PM. Reevaluated patient. Pain unchanged. X-ray results still pending. In the meantime, Toradol 60 mg IM ordered.  Imaging Review Dg Lumbar Spine Complete  09/29/2014   CLINICAL DATA:  Back and right thigh pain for 2 weeks  EXAM: LUMBAR SPINE - COMPLETE 4+ VIEW  COMPARISON:  CT scan 01/14/2013  FINDINGS: Normal alignment of the lumbar vertebral bodies. Mild degenerative disc disease and facet disease but no acute bony findings or destructive bony changes. No pars defects. The visualized bony pelvis is intact.  IMPRESSION: Degenerative changes but no acute bony findings.   Electronically Signed   By: Marijo Sanes M.D.   On: 09/29/2014 18:23     MDM   1. Lumbar back pain with radiculopathy affecting right lower extremity    I showed him on a dermatomal map that the numbness right great toe corresponds with branch of L4.--But he has no motor deficit.  Pain mildly improved after Toradol IM Reviewed with him that x-ray LS-spine showed no acute bony findings. There were some  degenerative changes, mild degenerative disc disease and facet disease. Treatment options discussed, as well as risks, benefits, alternatives. Patient voiced understanding and agreement with the following plans:  Depo-Medrol 80 mg IM stat. Starting tomorrow, oral prednisone burst, 20 mg twice a day 5 days. New Prescriptions   CYCLOBENZAPRINE (FLEXERIL) 5 MG TABLET    Take 1 or 2 every 8 hours as needed for muscle relaxant. Caution: May cause drowsiness.   IBUPROFEN (ADVIL,MOTRIN) 200 MG TABLET    Take three tablets ( 600 milligrams total) every 6 with food as needed for pain.   PREDNISONE (DELTASONE) 20 MG TABLET    Take 1 tablet (20 mg total) by mouth 2 (two) times daily with a meal. X 5 days.-- Start taking Friday  morning   He declined any controlled drug for pain. Other general advice given. He prefers ice as that somewhat relieves the pain. Handouts given. Follow-up with your PCP or ortho in 5-7 days if not improving, or sooner if symptoms become worse. Precautions discussed. Red flags discussed.--Emergency room if any red flag Questions invited and answered. Patient voiced understanding and agreement.    Jacqulyn Cane, MD 09/29/14 [phone]

## 2014-09-29 NOTE — ED Notes (Signed)
To X-ray

## 2014-09-29 NOTE — Discharge Instructions (Signed)
Sciatica °Sciatica is pain, weakness, numbness, or tingling along the path of the sciatic nerve. The nerve starts in the lower back and runs down the back of each leg. The nerve controls the muscles in the lower leg and in the back of the knee, while also providing sensation to the back of the thigh, lower leg, and the sole of your foot. Sciatica is a symptom of another medical condition. For instance, nerve damage or certain conditions, such as a herniated disk or bone spur on the spine, pinch or put pressure on the sciatic nerve. This causes the pain, weakness, or other sensations normally associated with sciatica. Generally, sciatica only affects one side of the body. °CAUSES  °· Herniated or slipped disc. °· Degenerative disk disease. °· A pain disorder involving the narrow muscle in the buttocks (piriformis syndrome). °· Pelvic injury or fracture. °· Pregnancy. °· Tumor (rare). °SYMPTOMS  °Symptoms can vary from mild to very severe. The symptoms usually travel from the low back to the buttocks and down the back of the leg. Symptoms can include: °· Mild tingling or dull aches in the lower back, leg, or hip. °· Numbness in the back of the calf or sole of the foot. °· Burning sensations in the lower back, leg, or hip. °· Sharp pains in the lower back, leg, or hip. °· Leg weakness. °· Severe back pain inhibiting movement. °These symptoms may get worse with coughing, sneezing, laughing, or prolonged sitting or standing. Also, being overweight may worsen symptoms. °DIAGNOSIS  °Your caregiver will perform a physical exam to look for common symptoms of sciatica. He or she may ask you to do certain movements or activities that would trigger sciatic nerve pain. Other tests may be performed to find the cause of the sciatica. These may include: °· Blood tests. °· X-rays. °· Imaging tests, such as an MRI or CT scan. °TREATMENT  °Treatment is directed at the cause of the sciatic pain. Sometimes, treatment is not necessary  and the pain and discomfort goes away on its own. If treatment is needed, your caregiver may suggest: °· Over-the-counter medicines to relieve pain. °· Prescription medicines, such as anti-inflammatory medicine, muscle relaxants, or narcotics. °· Applying heat or ice to the painful area. °· Steroid injections to lessen pain, irritation, and inflammation around the nerve. °· Reducing activity during periods of pain. °· Exercising and stretching to strengthen your abdomen and improve flexibility of your spine. Your caregiver may suggest losing weight if the extra weight makes the back pain worse. °· Physical therapy. °· Surgery to eliminate what is pressing or pinching the nerve, such as a bone spur or part of a herniated disk. °HOME CARE INSTRUCTIONS  °· Only take over-the-counter or prescription medicines for pain or discomfort as directed by your caregiver. °· Apply ice to the affected area for 20 minutes, 3-4 times a day for the first 48-72 hours. Then try heat in the same way. °· Exercise, stretch, or perform your usual activities if these do not aggravate your pain. °· Attend physical therapy sessions as directed by your caregiver. °· Keep all follow-up appointments as directed by your caregiver. °· Do not wear high heels or shoes that do not provide proper support. °· Check your mattress to see if it is too soft. A firm mattress may lessen your pain and discomfort. °SEEK IMMEDIATE MEDICAL CARE IF:  °· You lose control of your bowel or bladder (incontinence). °· You have increasing weakness in the lower back, pelvis, buttocks,   or legs.  You have redness or swelling of your back.  You have a burning sensation when you urinate.  You have pain that gets worse when you lie down or awakens you at night.  Your pain is worse than you have experienced in the past.  Your pain is lasting longer than 4 weeks.  You are suddenly losing weight without reason. MAKE SURE YOU:  Understand these  instructions.  Will watch your condition.  Will get help right away if you are not doing well or get worse. Document Released: 05/21/2001 Document Revised: 11/26/2011 Document Reviewed: 10/06/2011 [facility] Patient Information 2015 [place], [place]. This information is not intended to replace advice given to you by your health care provider. Make sure you discuss any questions you have with your health care provider. Radicular Pain Radicular pain in either the arm or leg is usually from a bulging or herniated disk in the spine. A piece of the herniated disk may press against the nerves as the nerves exit the spine. This causes pain which is felt at the tips of the nerves down the arm or leg. Other causes of radicular pain may include:  Fractures.  Heart disease.  Cancer.  An abnormal and usually degenerative state of the nervous system or nerves (neuropathy). Diagnosis may require CT or MRI scanning to determine the primary cause.  Nerves that start at the neck (nerve roots) may cause radicular pain in the outer shoulder and arm. It can spread down to the thumb and fingers. The symptoms vary depending on which nerve root has been affected. In most cases radicular pain improves with conservative treatment. Neck problems may require physical therapy, a neck collar, or cervical traction. Treatment may take many weeks, and surgery may be considered if the symptoms do not improve.  Conservative treatment is also recommended for sciatica. Sciatica causes pain to radiate from the lower back or buttock area down the leg into the foot. Often there is a history of back problems. Most patients with sciatica are better after 2 to 4 weeks of rest and other supportive care. Short term bed rest can reduce the disk pressure considerably. Sitting, however, is not a good position since this increases the pressure on the disk. You should avoid bending, lifting, and all other activities which make the problem worse.  Traction can be used in severe cases. Surgery is usually reserved for patients who do not improve within the first months of treatment. Only take over-the-counter or prescription medicines for pain, discomfort, or fever as directed by your caregiver. Narcotics and muscle relaxants may help by relieving more severe pain and spasm and by providing mild sedation. Cold or massage can give significant relief. Spinal manipulation is not recommended. It can increase the degree of disc protrusion. Epidural steroid injections are often effective treatment for radicular pain. These injections deliver medicine to the spinal nerve in the space between the protective covering of the spinal cord and back bones (vertebrae). Your caregiver can give you more information about steroid injections. These injections are most effective when given within two weeks of the onset of pain.  You should see your caregiver for follow up care as recommended. A program for neck and back injury rehabilitation with stretching and strengthening exercises is an important part of management.  SEEK IMMEDIATE MEDICAL CARE IF:  You develop increased pain, weakness, or numbness in your arm or leg.  You develop difficulty with bladder or bowel control.  You develop abdominal pain. Document Released:  07/04/2004 Document Revised: 08/19/2011 Document Reviewed: 09/19/2008 [employer] Patient Information 2015 [place], [place]. This information is not intended to replace advice given to you by your health care provider. Make sure you discuss any questions you have with your health care provider.

## 2014-09-29 NOTE — ED Notes (Signed)
Patient presents to the [facility] with C/O lower back pain getting worse over 2 1/2 weeks after a white water rafting trip. He rates pain a 10/10 with movement and a 3/10 without movement.

## 2015-07-02 IMAGING — CR DG ABDOMEN 1V
2 series · 2 of 2 positions shown · non-contrast
Comparison: CT 01/14/2013

CLINICAL DATA: Constipation.  Left lower quadrant pain

EXAM:
ABDOMEN - 1 VIEW

[view not recorded (1 of 2)]
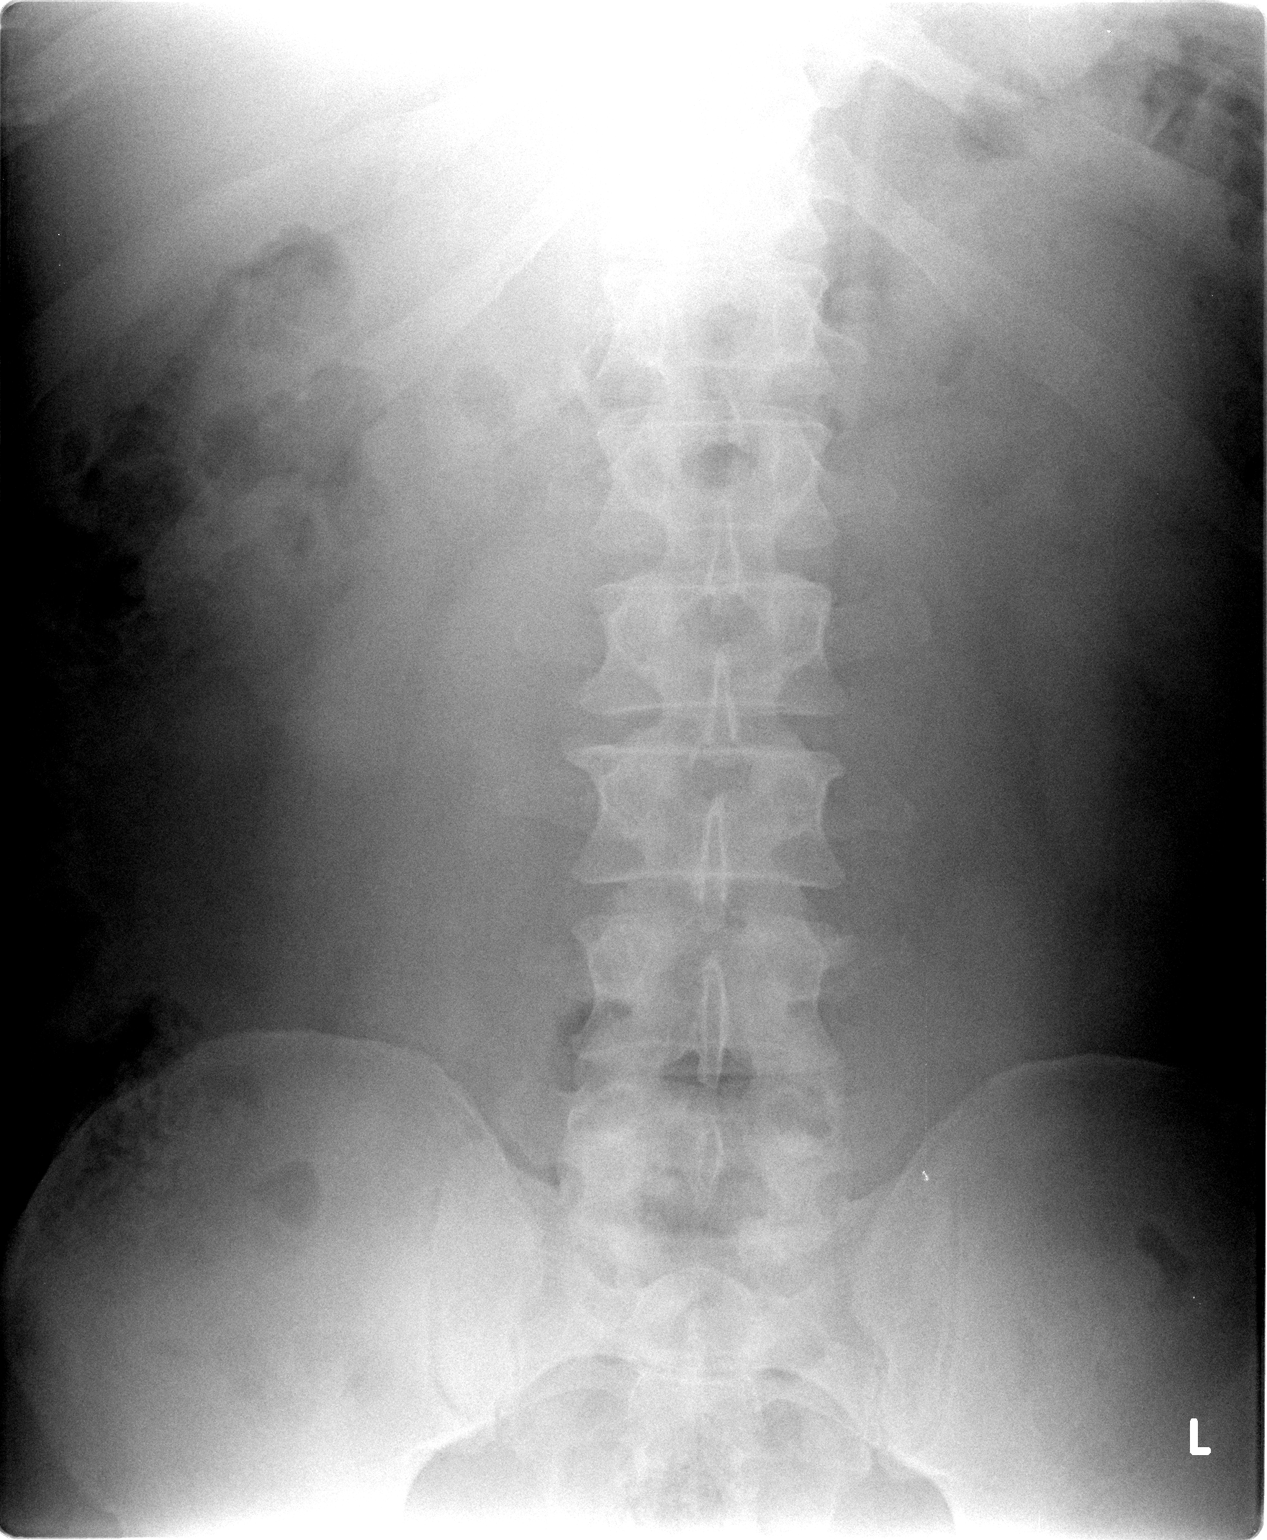

[view not recorded (2 of 2)]
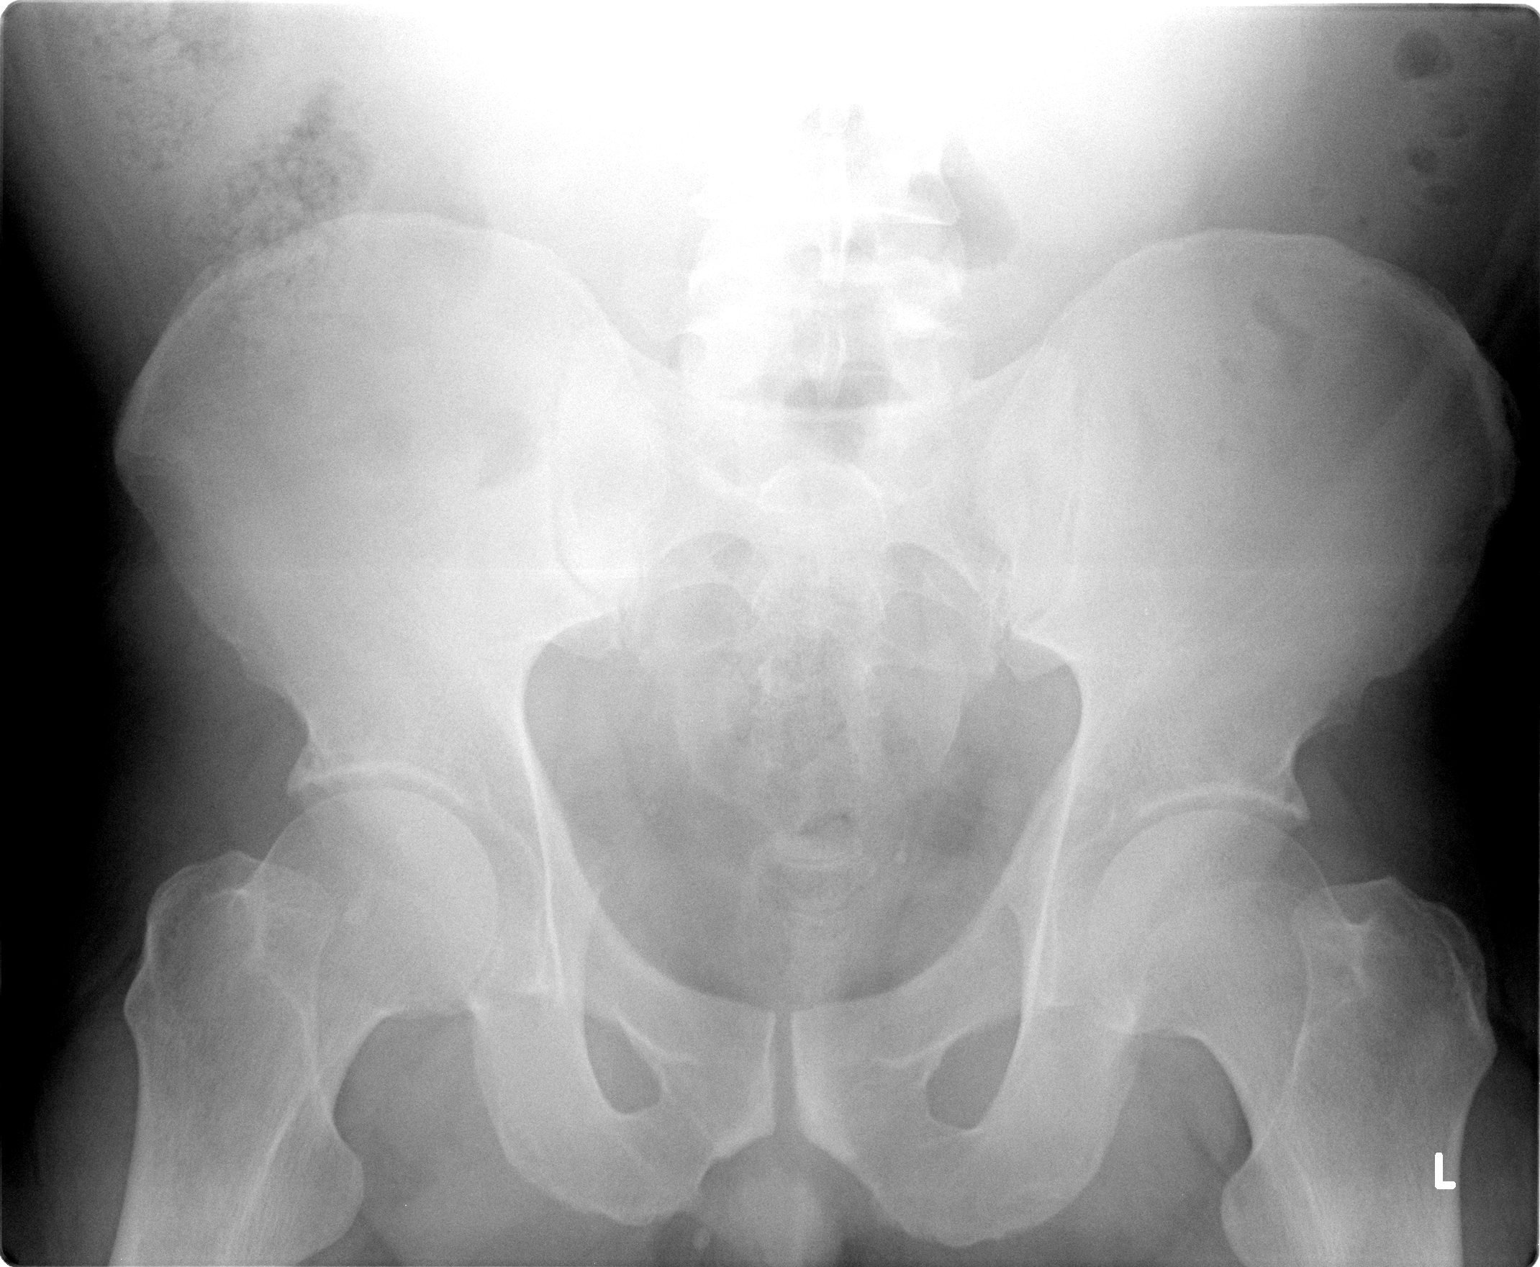

[2 of 2 positions shown; findings below may reference images not displayed]

FINDINGS: Normal bowel gas pattern. Negative for obstruction or constipation.
No significant bony abnormality. No renal calculi.
IMPRESSION: Negative.

## 2015-07-20 ENCOUNTER — Encounter: Payer: Self-pay | Admitting: Family Medicine

## 2015-07-20 ENCOUNTER — Ambulatory Visit (INDEPENDENT_AMBULATORY_CARE_PROVIDER_SITE_OTHER): Payer: BLUE CROSS/BLUE SHIELD | Admitting: Family Medicine

## 2015-07-20 VITALS — BP 141/75 | HR 77 | Temp 98.1°F | Ht 76.0 in | Wt 322.2 lb

## 2015-07-20 DIAGNOSIS — K429 Umbilical hernia without obstruction or gangrene: Secondary | ICD-10-CM

## 2015-07-20 MED ORDER — PROMETHAZINE HCL 25 MG PO TABS: 25.0000 mg | ORAL_TABLET | Freq: Three times a day (TID) | ORAL | Status: DC | PRN

## 2015-07-20 NOTE — Progress Notes (Signed)
Subjective:    Patient ID: Adam Hamilton, male    DOB: Oct 09, 1970, 45 y.o.   MRN: [id number]  HPI  patient comes in today for umbilical pain. Yesterday he noticed some discomfort near his belly button and noticed it was protruding more. He says he has had some intermittant nausea with it and feeling clammy.  Says it is painful if he coughs, sneezes or get in and out of the care.  Last BM was 2 days ago and usually he goes daily. He takes a fiber supplement and probiotic yogurt.  He has been passing gas. Says if he presses on the area it pops right back out. No fever. Denies any recent heavy lifting or straining.  Review of Systems  BP 141/75 mmHg  Pulse 77  Temp(Src) 98.1 F (36.7 C)  Ht 6\' 4"  (1.93 m)  Wt 322 lb 3.2 oz (146.149 kg)  BMI 39.24 kg/m2  SpO2 99%    Allergies  Allergen Reactions  . Linzess [Linaclotide] Nausea Only    Past Medical History  Diagnosis Date  . Kidney stones   . Postural dizziness   . OSA (obstructive sleep apnea)     Past Surgical History  Procedure Laterality Date  . Knee surgery  2011  . Tonsillectomy and adenoidectomy      For snoring.     Social History   Social History  . Marital Status: Married    Spouse Name: Angie  . Number of Children: 3  . Years of Education: 2   Occupational History  . Research officer, trade union   Social History Main Topics  . Smoking status: Former Smoker -- 0.50 packs/day for 20 years    Quit date: 01/13/2012  . Smokeless tobacco: Never Used  . Alcohol Use: 0.5 - 2.5 oz/week    1-5 drink(s) per week     Comment: Rarely  . Drug Use: No  . Sexual Activity: Yes   Other Topics Concern  . Not on file   Social History Narrative   2-3 caffein drinks per day. Some regular exercise.     Family History  Problem Relation Age of Onset  . Diabetes Mother   . Stroke Mother 95  . Hypertension Mother   . Colon polyps Mother   . Heart attack Neg Hx   . Hyperlipidemia  Neg Hx   . Sudden death Neg Hx   . Stomach cancer Father 32  . Lymphoma Paternal Grandfather 47  . Prostate cancer Paternal Grandfather     Outpatient Encounter Prescriptions as of 07/20/2015  Medication Sig  . promethazine (PHENERGAN) 25 MG tablet Take 1 tablet (25 mg total) by mouth every 8 (eight) hours as needed for nausea or vomiting.  . [DISCONTINUED] cyclobenzaprine (FLEXERIL) 5 MG tablet Take 1 or 2 every 8 hours as needed for muscle relaxant. Caution: May cause drowsiness.  . [DISCONTINUED] ibuprofen (ADVIL,MOTRIN) 200 MG tablet Take three tablets ( 600 milligrams total) every 6 with food as needed for pain.  . [DISCONTINUED] omeprazole (PRILOSEC) 20 MG capsule Take 20 mg by mouth daily.  . [DISCONTINUED] predniSONE (DELTASONE) 20 MG tablet Take 1 tablet (20 mg total) by mouth 2 (two) times daily with a meal. X 5 days.-- Start taking Friday morning   No facility-administered encounter medications on file as of 07/20/2015.          Objective:   Physical Exam  Constitutional: He is oriented to person, place, and time.  He appears well-developed and well-nourished.  HENT:  Head: Normocephalic and atraumatic.  Abdominal: Soft. Bowel sounds are normal. He exhibits no distension and no mass. There is tenderness. There is no rebound and no guarding.  He has a superior umbilical hernia. It is reducible but is tender on exam.    Neurological: He is alert and oriented to person, place, and time.  Skin: Skin is warm and dry.  Psychiatric: He has a normal mood and affect. His behavior is normal.          Assessment & Plan:  Umbilical hernia-discussed warning signs and symptoms to go to the ED if it is not reducible or becomes severe pain for the starts to vomit. In the meantime we'll try to work on getting him in maternal surgery for consult for repair especially since he is symptomatic with his.  Given Phenergan when necessary. We were able to get him an appointment on Monday with  [facility].

## 2015-07-20 NOTE — Patient Instructions (Signed)
Hernia, Adult A hernia is the bulging of an organ or tissue through a weak spot in the muscles of the abdomen (abdominal wall). Hernias develop most often near the navel or groin. There are many kinds of hernias. Common kinds include:  Femoral hernia. This kind of hernia develops under the groin in the upper thigh area.  Inguinal hernia. This kind of hernia develops in the groin or scrotum.  Umbilical hernia. This kind of hernia develops near the navel.  Hiatal hernia. This kind of hernia causes part of the stomach to be pushed up into the chest.  Incisional hernia. This kind of hernia bulges through a scar from an abdominal surgery. CAUSES This condition may be caused by:  Heavy lifting.  Coughing over a long period of time.  Straining to have a bowel movement.  An incision made during an abdominal surgery.  A birth defect (congenital defect).  Excess weight or obesity.  Smoking.  Poor nutrition.  Cystic fibrosis.  Excess fluid in the abdomen.  Undescended testicles. SYMPTOMS Symptoms of a hernia include:  A lump on the abdomen. This is the first sign of a hernia. The lump may become more obvious with standing, straining, or coughing. It may get bigger over time if it is not treated or if the condition causing it is not treated.  Pain. A hernia is usually painless, but it may become painful over time if treatment is delayed. The pain is usually dull and may get worse with standing or lifting heavy objects. Sometimes a hernia gets tightly squeezed in the weak spot (strangulated) or stuck there (incarcerated) and causes additional symptoms. These symptoms may include:  Vomiting.  Nausea.  Constipation.  Irritability. DIAGNOSIS A hernia may be diagnosed with:  A physical exam. During the exam your health care provider may ask you to cough or to make a specific movement, because a hernia is usually more visible when you move.  Imaging tests. These can  include:  X-rays.  Ultrasound.  CT scan. TREATMENT A hernia that is small and painless may not need to be treated. A hernia that is large or painful may be treated with surgery. Inguinal hernias may be treated with surgery to prevent incarceration or strangulation. Strangulated hernias are always treated with surgery, because lack of blood to the trapped organ or tissue can cause it to die. Surgery to treat a hernia involves pushing the bulge back into place and repairing the weak part of the abdomen. HOME CARE INSTRUCTIONS  Avoid straining.  Do not lift anything heavier than 10 lb (4.5 kg).  Lift with your leg muscles, not your back muscles. This helps avoid strain.  When coughing, try to cough gently.  Prevent constipation. Constipation leads to straining with bowel movements, which can make a hernia worse or cause a hernia repair to break down. You can prevent constipation by:  Eating a high-fiber diet that includes plenty of fruits and vegetables.  Drinking enough fluids to keep your urine clear or pale yellow. Aim to drink 6-8 glasses of water per day.  Using a stool softener as directed by your health care provider.  Lose weight, if you are overweight.  Do not use any tobacco products, including cigarettes, chewing tobacco, or electronic cigarettes. If you need help quitting, ask your health care provider.  Keep all follow-up visits as directed by your health care provider. This is important. Your health care provider may need to monitor your condition. SEEK MEDICAL CARE IF:  You have   swelling, redness, and pain in the affected area.  Your bowel habits change. SEEK IMMEDIATE MEDICAL CARE IF:  You have a fever.  You have abdominal pain that is getting worse.  You feel nauseous or you vomit.  You cannot push the hernia back in place by gently pressing on it while you are lying down.  The hernia:  Changes in shape or size.  Is stuck outside the  abdomen.  Becomes discolored.  Feels hard or tender.   This information is not intended to replace advice given to you by your health care provider. Make sure you discuss any questions you have with your health care provider.   Document Released: 05/27/2005 Document Revised: 06/17/2014 Document Reviewed: 04/06/2014 Elsevier Interactive Patient Education 2016 Elsevier Inc.  

## 2017-01-22 ENCOUNTER — Encounter: Payer: Self-pay | Admitting: Family Medicine

## 2017-01-22 ENCOUNTER — Ambulatory Visit (INDEPENDENT_AMBULATORY_CARE_PROVIDER_SITE_OTHER): Payer: BLUE CROSS/BLUE SHIELD | Admitting: Family Medicine

## 2017-01-22 DIAGNOSIS — M7711 Lateral epicondylitis, right elbow: Secondary | ICD-10-CM | POA: Diagnosis not present

## 2017-01-22 DIAGNOSIS — M25531 Pain in right wrist: Secondary | ICD-10-CM | POA: Diagnosis not present

## 2017-01-22 MED ORDER — NITROGLYCERIN 0.2 MG/HR TD PT24
MEDICATED_PATCH | TRANSDERMAL | 1 refills | Status: DC
Start: 2017-01-22 — End: 2017-07-23

## 2017-01-22 NOTE — Patient Instructions (Addendum)
Thank you for coming in today. Do the stretching as much as you can  Do the wrist exercise as much as you can  Use a 1-2 pound hand weight.  Try to get 30 reps 2-3 x daily You can also use a torq stick.   Recheck in 6 weeks.    Nitroglycerin Protocol   Apply 1/4 nitroglycerin patch to affected area daily.  Change position of patch within the affected area every 24 hours.  You may experience a headache during the first 1-2 weeks of using the patch, these should subside.  If you experience headaches after beginning nitroglycerin patch treatment, you may take your preferred over the counter pain reliever.  Another side effect of the nitroglycerin patch is skin irritation or rash related to patch adhesive.  Please notify our office if you develop more severe headaches or rash, and stop the patch.  Tendon healing with nitroglycerin patch may require 12 to 24 weeks depending on the extent of injury.  Men should not use if taking Viagra, Cialis, or Levitra.   Do not use if you have migraines or rosacea.    Tennis Elbow Tennis elbow (lateral epicondylitis) is inflammation of the outer tendons of your forearm close to your elbow. Your tendons attach your muscles to your bones. The outer tendons of your forearm are used to extend your wrist, and they attach on the outside part of your elbow. Tennis elbow is often found in people who play tennis, but anyone may get the condition from repeatedly extending the wrist or turning the forearm. What are the causes? This condition is caused by repeatedly extending your wrist and using your hands. It can result from sports or work that requires repetitive forearm movements. Tennis elbow may also be caused by an injury. What increases the risk? You have a higher risk of developing tennis elbow if you play tennis or another racquet sport. You also have a higher risk if you frequently use your hands for work. This condition is also more likely to  develop in:  Musicians.  Carpenters, painters, and plumbers.  Cooks.  Cashiers.  People who work in [employer].  Architect workers.  Butchers.  People who use computers.  What are the signs or symptoms? Symptoms of this condition include:  Pain and tenderness in your forearm and the outer part of your elbow. You may only feel the pain when you use your arm, or you may feel it even when you are not using your arm.  A burning feeling that runs from your elbow through your arm.  Weak grip in your hands.  How is this diagnosed? This condition may be diagnosed by medical history and physical exam. You may also have other tests, including:  X-rays.  MRI.  How is this treated? Your health care provider will recommend lifestyle adjustments, such as resting and icing your arm. Treatment may also include:  Medicines for inflammation. This may include shots of cortisone if your pain continues.  Physical therapy. This may include massage or exercises.  An elbow brace.  Surgery may eventually be recommended if your pain does not go away with treatment. Follow these instructions at home: Activity  Rest your elbow and wrist as directed by your health care provider. Try to avoid any activities that caused the problem until your health care provider says that you can do them again.  If a physical therapist teaches you exercises, do all of them as directed.  If you lift an object,  lift it with your palm facing upward. This lowers the stress on your elbow. Lifestyle  If your tennis elbow is caused by sports, check your equipment and make sure that: ? You are using it correctly. ? It is the best fit for you.  If your tennis elbow is caused by work, take breaks frequently, if you are able. Talk with your manager about how to best perform tasks in a way that is safe. ? If your tennis elbow is caused by computer use, talk with your manager about any changes that can be made to  your work environment. General instructions  If directed, apply ice to the painful area: ? Put ice in a plastic bag. ? Place a towel between your skin and the bag. ? Leave the ice on for 20 minutes, 2-3 times per day.  Take medicines only as directed by your health care provider.  If you were given a brace, wear it as directed by your health care provider.  Keep all follow-up visits as directed by your health care provider. This is important. Contact a health care provider if:  Your pain does not get better with treatment.  Your pain gets worse.  You have numbness or weakness in your forearm, hand, or fingers. This information is not intended to replace advice given to you by your health care provider. Make sure you discuss any questions you have with your health care provider. Document Released: 05/27/2005 Document Revised: 01/25/2016 Document Reviewed: 05/23/2014 Elsevier Interactive Patient Education  [employer].

## 2017-01-22 NOTE — Progress Notes (Signed)
   Adam Hamilton is a 46 y.o. male who presents to [facility] today for elbow pain. Patient notes right lateral elbow pain for about 2 months. He denies any injury. He is right-hand dominant. He notes the pain is worse with resisted wrist extension. No fevers or chills nausea vomiting or diarrhea. He has tried some over-the-counter medications for pain which have helped a bit. He works a Designer, multimedia.   Past Medical History:  Diagnosis Date  . Kidney stones   . OSA (obstructive sleep apnea)   . Postural dizziness    Past Surgical History:  Procedure Laterality Date  . KNEE SURGERY  2011  . TONSILLECTOMY AND ADENOIDECTOMY     For snoring.    Social History  Substance Use Topics  . Smoking status: Former Smoker    Packs/day: 0.50    Years: 20.00    Quit date: 01/13/2012  . Smokeless tobacco: Never Used  . Alcohol use 0.5 - 2.5 oz/week    1 - 5 drink(s) per week     Comment: Rarely     ROS:  As above   Medications: Current Outpatient Prescriptions  Medication Sig Dispense Refill  . nitroGLYCERIN (NITRODUR - DOSED IN MG/24 HR) 0.2 mg/hr patch 1/4 patch to elbow daily for tendonitis 30 patch 1   No current facility-administered medications for this visit.    Allergies  Allergen Reactions  . Linzess [Linaclotide] Nausea Only     Exam:  BP 123/75   Pulse 78   Wt (!) 315 lb (142.9 kg)   BMI 38.34 kg/m  General: Well Developed, well nourished, and in no acute distress.  Neuro/Psych: Alert and oriented x3, extra-ocular muscles intact, able to move all 4 extremities, sensation grossly intact. Skin: Warm and dry, no rashes noted.  Respiratory: Not using accessory muscles, speaking in full sentences, trachea midline.  Cardiovascular: Pulses palpable, no extremity edema. Abdomen: Does not appear distended. MSK:  Right elbow is normal appearing with normal motion. Tender to palpation lateral epicondyle. Pain with resisted wrist  extension. Pulses capillary refill and sensation are intact.    No results found for this or any previous visit (from the past 48 hour(s)). No results found.    Assessment and Plan: 46 y.o. male with lateral epicondylitis of the right elbow. Plan for treatment with nitroglycerin patches as well as eccentric exercises with a home physical therapy protocol . Additionally patient will use prescription strength over-the-counter medications as needed  Plan to recheck in about 6 weeks.   No orders of the defined types were placed in this encounter.  Meds ordered this encounter  Medications  . nitroGLYCERIN (NITRODUR - DOSED IN MG/24 HR) 0.2 mg/hr patch    Sig: 1/4 patch to elbow daily for tendonitis    Dispense:  30 patch    Refill:  1    Discussed warning signs or symptoms. Please see discharge instructions. Patient expresses understanding.

## 2017-03-05 ENCOUNTER — Encounter: Payer: Self-pay | Admitting: Family Medicine

## 2017-03-05 ENCOUNTER — Ambulatory Visit (INDEPENDENT_AMBULATORY_CARE_PROVIDER_SITE_OTHER): Payer: BLUE CROSS/BLUE SHIELD | Admitting: Family Medicine

## 2017-03-05 VITALS — BP 123/77 | HR 78

## 2017-03-05 DIAGNOSIS — M7711 Lateral epicondylitis, right elbow: Secondary | ICD-10-CM

## 2017-03-05 NOTE — Patient Instructions (Signed)
Thank you for coming in today. Call or go to the ER if you develop a large red swollen joint with extreme pain or oozing puss.  Recheck in 1 month or so.  Continue exercises.

## 2017-03-06 NOTE — Progress Notes (Signed)
   Adam Hamilton is a 46 y.o. male who presents to [place] today for right elbow pain. Patient was seen August 15 right elbow pain thought to be lateral epicondylitis. He had a trial of nitroglycerin patches and home exercise program. He notes tenderness in improvement but continues to have significant pain in the lateral elbow with wrist extension. This limits his ability to do normal life activities without significant pain. He tolerates a nitroglycerin patch as well. He feels well otherwise no fevers or chills.   Past Medical History:  Diagnosis Date  . Kidney stones   . OSA (obstructive sleep apnea)   . Postural dizziness    Past Surgical History:  Procedure Laterality Date  . KNEE SURGERY  2011  . TONSILLECTOMY AND ADENOIDECTOMY     For snoring.    Social History  Substance Use Topics  . Smoking status: Former Smoker    Packs/day: 0.50    Years: 20.00    Quit date: 01/13/2012  . Smokeless tobacco: Never Used  . Alcohol use 0.5 - 2.5 oz/week    1 - 5 drink(s) per week     Comment: Rarely     ROS:  As above   Medications: Current Outpatient Prescriptions  Medication Sig Dispense Refill  . nitroGLYCERIN (NITRODUR - DOSED IN MG/24 HR) 0.2 mg/hr patch 1/4 patch to elbow daily for tendonitis 30 patch 1   No current facility-administered medications for this visit.    Allergies  Allergen Reactions  . Linzess [Linaclotide] Nausea Only     Exam:  BP 123/77   Pulse 78  General: Well Developed, well nourished, and in no acute distress.  Neuro/Psych: Alert and oriented x3, extra-ocular muscles intact, able to move all 4 extremities, sensation grossly intact. Skin: Warm and dry, no rashes noted.  Respiratory: Not using accessory muscles, speaking in full sentences, trachea midline.  Cardiovascular: Pulses palpable, no extremity edema. Abdomen: Does not appear distended. MSK:  Right elbow normal-appearing tender to  palpation lateral epicondyle. Pain with resisted wrist extension. Strength is intact.  Limited musculoskeletal ultrasound of the right lateral elbow reveals an intact common extensor tendon insertion on the lateral epicondyle. There is an avulsion Flack at the tip of the epicondyle. No obvious tendon tears are present. The appearance of the ultrasound was consistent with lateral epicondylitis.  Lateral epicondyle injection: Consent obtained and timeout performed. We discussed risks including infection fat atrophy skin hypopigmentation hyperglycemia among others. Skin cleaned with alcohol and chlorhexidine and cold spray applied. A 25-gauge needle was used to access the lateral epicondyle and common extensor tendon insertion. 40 mg of Depo-Medrol and 1 mL of Marcaine were injected and a fan pattern. Patient tolerated the procedure well.  No results found for this or any previous visit (from the past 48 hour(s)). No results found.    Assessment and Plan: 46 y.o. male with right lateral epicondylitis. Slight improvement but not sufficient with home exercise program and nitroglycerin patches. We discussed different options including referral to physical therapy versus steroid injection versus platelet rich plasma injection. Patient elected for steroid injection. Plan to continue home physical therapy activities as well as nitroglycerin patches. Recheck in about a month.    No orders of the defined types were placed in this encounter.  No orders of the defined types were placed in this encounter.   Discussed warning signs or symptoms. Please see discharge instructions. Patient expresses understanding.

## 2017-03-10 DIAGNOSIS — H5213 Myopia, bilateral: Secondary | ICD-10-CM | POA: Insufficient documentation

## 2017-03-10 DIAGNOSIS — H52203 Unspecified astigmatism, bilateral: Secondary | ICD-10-CM

## 2017-03-10 DIAGNOSIS — H524 Presbyopia: Secondary | ICD-10-CM

## 2017-04-02 ENCOUNTER — Encounter: Payer: Self-pay | Admitting: Family Medicine

## 2017-04-02 ENCOUNTER — Ambulatory Visit (INDEPENDENT_AMBULATORY_CARE_PROVIDER_SITE_OTHER): Payer: BLUE CROSS/BLUE SHIELD | Admitting: Family Medicine

## 2017-04-02 VITALS — BP 118/76 | HR 79 | Wt 324.0 lb

## 2017-04-02 DIAGNOSIS — M7711 Lateral epicondylitis, right elbow: Secondary | ICD-10-CM

## 2017-04-02 NOTE — Patient Instructions (Signed)
Thank you for coming in today.  Recheck as needed.   Continue the exercises.    

## 2017-04-03 NOTE — Progress Notes (Signed)
   Adam Hamilton is a 46 y.o. male who presents to [place] today for right tennis elbow.  Patient has been seen several times since August for right lateral epicondylitis.  He received a steroid injection September 26 and is now completely asymptomatic.  He feels great.   Past Medical History:  Diagnosis Date  . Kidney stones   . OSA (obstructive sleep apnea)   . Postural dizziness    Past Surgical History:  Procedure Laterality Date  . KNEE SURGERY  2011  . TONSILLECTOMY AND ADENOIDECTOMY     For snoring.    Social History  Substance Use Topics  . Smoking status: Former Smoker    Packs/day: 0.50    Years: 20.00    Quit date: 01/13/2012  . Smokeless tobacco: Never Used  . Alcohol use 0.5 - 2.5 oz/week    1 - 5 drink(s) per week     Comment: Rarely     ROS:  As above   Medications: Current Outpatient Prescriptions  Medication Sig Dispense Refill  . nitroGLYCERIN (NITRODUR - DOSED IN MG/24 HR) 0.2 mg/hr patch 1/4 patch to elbow daily for tendonitis 30 patch 1   No current facility-administered medications for this visit.    Allergies  Allergen Reactions  . Linzess [Linaclotide] Nausea Only     Exam:  BP 118/76   Pulse 79   Wt (!) 324 lb (147 kg)   BMI 39.44 kg/m  General: Well Developed, well nourished, and in no acute distress.  Neuro/Psych: Alert and oriented x3, extra-ocular muscles intact, able to move all 4 extremities, sensation grossly intact. Skin: Warm and dry, no rashes noted.  Respiratory: Not using accessory muscles, speaking in full sentences, trachea midline.  Cardiovascular: Pulses palpable, no extremity edema. Abdomen: Does not appear distended. MSK: Right elbow nontender no pain with resisted wrist extension normal grip strength.    No results found for this or any previous visit (from the past 48 hour(s)). No results found.    Assessment and Plan: 46 y.o. male with clinically resolved  right lateral epicondylitis.  Plan for home exercise program.  Return as needed.    No orders of the defined types were placed in this encounter.  No orders of the defined types were placed in this encounter.   Discussed warning signs or symptoms. Please see discharge instructions. Patient expresses understanding.

## 2017-07-22 DIAGNOSIS — R079 Chest pain, unspecified: Secondary | ICD-10-CM | POA: Diagnosis not present

## 2017-07-22 DIAGNOSIS — R002 Palpitations: Secondary | ICD-10-CM | POA: Diagnosis not present

## 2017-07-23 ENCOUNTER — Encounter: Payer: Self-pay | Admitting: Physician Assistant

## 2017-07-23 ENCOUNTER — Ambulatory Visit (INDEPENDENT_AMBULATORY_CARE_PROVIDER_SITE_OTHER): Payer: BLUE CROSS/BLUE SHIELD | Admitting: Physician Assistant

## 2017-07-23 VITALS — BP 139/87 | HR 90 | Temp 97.9°F | Wt 327.0 lb

## 2017-07-23 DIAGNOSIS — K760 Fatty (change of) liver, not elsewhere classified: Secondary | ICD-10-CM

## 2017-07-23 DIAGNOSIS — Z87442 Personal history of urinary calculi: Secondary | ICD-10-CM | POA: Insufficient documentation

## 2017-07-23 DIAGNOSIS — Z9289 Personal history of other medical treatment: Secondary | ICD-10-CM

## 2017-07-23 DIAGNOSIS — Z13220 Encounter for screening for lipoid disorders: Secondary | ICD-10-CM

## 2017-07-23 DIAGNOSIS — Z130 Encounter for screening for diseases of the blood and blood-forming organs and certain disorders involving the immune mechanism: Secondary | ICD-10-CM | POA: Diagnosis not present

## 2017-07-23 DIAGNOSIS — Z09 Encounter for follow-up examination after completed treatment for conditions other than malignant neoplasm: Secondary | ICD-10-CM

## 2017-07-23 DIAGNOSIS — R42 Dizziness and giddiness: Secondary | ICD-10-CM

## 2017-07-23 DIAGNOSIS — G4733 Obstructive sleep apnea (adult) (pediatric): Secondary | ICD-10-CM | POA: Diagnosis not present

## 2017-07-23 DIAGNOSIS — Z1329 Encounter for screening for other suspected endocrine disorder: Secondary | ICD-10-CM | POA: Diagnosis not present

## 2017-07-23 DIAGNOSIS — R55 Syncope and collapse: Secondary | ICD-10-CM

## 2017-07-23 DIAGNOSIS — B354 Tinea corporis: Secondary | ICD-10-CM

## 2017-07-23 DIAGNOSIS — I951 Orthostatic hypotension: Secondary | ICD-10-CM | POA: Diagnosis not present

## 2017-07-23 DIAGNOSIS — R002 Palpitations: Secondary | ICD-10-CM | POA: Insufficient documentation

## 2017-07-23 DIAGNOSIS — Z131 Encounter for screening for diabetes mellitus: Secondary | ICD-10-CM | POA: Diagnosis not present

## 2017-07-23 DIAGNOSIS — Z1389 Encounter for screening for other disorder: Secondary | ICD-10-CM

## 2017-07-23 HISTORY — DX: Fatty (change of) liver, not elsewhere classified: K76.0

## 2017-07-23 NOTE — Patient Instructions (Addendum)
Fasting labs tomorrow. The lab is a walk-in open M-F 7:30a-4:30p (closed 12:30-1:30p). Nothing to eat or drink after midnight or at least 8 hours before your blood draw. You can have water and your medications.    Cardiac Event Monitoring A cardiac event monitor is a small recording device that is used to detect abnormal heart rhythms (arrhythmias). The monitor is used to record your heart rhythm when you have symptoms, such as:  Fast heartbeats (palpitations), such as heart racing or fluttering.  Dizziness.  Fainting or light-headedness.  Unexplained weakness.  Some monitors are wired to electrodes placed on your chest. Electrodes are flat, sticky disks that attach to your skin. Other monitors may be hand-held or worn on the wrist. The monitor can be worn for up to 30 days. If the monitor is attached to your chest, a technician will prepare your chest for the electrode placement and show you how to work the monitor. Take time to practice using the monitor before you leave the office. Make sure you understand how to send the information from the monitor to your health care provider. In some cases, you may need to use a landline telephone instead of a cell phone. What are the risks? Generally, this device is safe to use, but it possible that the skin under the electrodes will become irritated. How to use your cardiac event monitor  Wear your monitor at all times, except when you are in water: ? Do not let the monitor get wet. ? Take the monitor off when you bathe. Do not swim or use a hot tub with it on.  Keep your skin clean. Do not put body lotion or moisturizer on your chest.  Change the electrodes as told by your health care provider or any time they stop sticking to your skin. You may need to use medical tape to keep them on.  Try to put the electrodes in slightly different places on your chest to help prevent skin irritation. They must remain in the area under your left breast and in  the upper right section of your chest.  Make sure the monitor is safely clipped to your clothing or in a location close to your body that your health care provider recommends.  Press the button to record as soon as you feel heart-related symptoms, such as: ? Dizziness. ? Weakness. ? Light-headedness. ? Palpitations. ? Thumping or pounding in your chest. ? Shortness of breath. ? Unexplained weakness.  Keep a diary of your activities, such as walking, doing chores, and taking medicine. It is very important to note what you were doing when you pushed the button to record your symptoms. This will help your health care provider determine what might be contributing to your symptoms.  Send the recorded information as recommended by your health care provider. It may take some time for your health care provider to process the results.  Change the batteries as told by your health care provider.  Keep electronic devices away from your monitor. This includes: ? Tablets. ? MP3 players. ? Cell phones.  While wearing your monitor you should avoid: ? Electric blankets. ? Armed forces operational officer. ? Electric toothbrushes. ? Microwave ovens. ? Magnets. ? Metal detectors. Get help right away if:  You have chest pain.  You have extreme difficulty breathing or shortness of breath.  You develop a very fast heartbeat that persists.  You develop dizziness that does not go away.  You faint or constantly feel like you are about to  faint. Summary  A cardiac event monitor is a small recording device that is used to help detect abnormal heart rhythms (arrhythmias).  The monitor is used to record your heart rhythm when you have heart-related symptoms.  Make sure you understand how to send the information from the monitor to your health care provider.  It is important to press the button on the monitor when you have any heart-related symptoms.  Keep a diary of your activities, such as walking, doing  chores, and taking medicine. It is very important to note what you were doing when you pushed the button to record your symptoms. This will help your health care provider learn what might be causing your symptoms. This information is not intended to replace advice given to you by your health care provider. Make sure you discuss any questions you have with your health care provider. Document Released: 03/05/2008 Document Revised: 05/11/2016 Document Reviewed: 05/11/2016 Elsevier Interactive Patient Education  2017 [employer].

## 2017-07-23 NOTE — Progress Notes (Signed)
HPI:                                                                Adam Hamilton is a 47 y.o. male who presents to [place]: [facility] today for hospital follow-up  Pleasant 47 yo M with PMH of OSA, postural orthostasis, fatty liver, and obesity reports he was seen at [facility] yesterday for heart palpitations. Reports sudden onset "heart racing" and feeling like heart was beating out of his chest yesterday evening. Today he reports he feels like his chest wall and upper abdomen are "constantly hooked up to a TENS unit." Denies any associated chest pain, dyspnea, diaphoresis, nausea, or syncope. Denies hx of anxiety or panic. We do not have access to these records today, but he reports having a full negative cardiac work-up, including CXR, ECG x 2 and troponins x 2. States he was told there were no abnormalities.  He continues to endorse postural dizziness, which is oddly relieved with putting his arms above his head and hyperextending his upper body. Denies any syncope or seizures.  He was evaluated by Cardiology in 2015 for his dizziness. Work-up included 24-hr holter 03/2014 showing avg HR 84, max 141, min 53, NSR, no arrhythmia or pauses 03/30/2014 Exercise Stress test negative He was diagnosed with orthostatic hypotension/autonomic dysfunction and instructed to take salt tabs and was lost to follow-up. He is not currently taking salt tab, but is drinking 1.5-2L of water per day.  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 States his sister also has this renal abnormality and also has the same orthostatic symptoms. He has a history of nephrolithiasis as a young adult requiring lithotripsy. He underwent CT abd/pel in August 2014, which showed structurally normal kidneys, no hydronephrosis or calculi. Imaging did reveal fatty liver.  Depression screen [facility] 2/9 03/05/2017  Decreased Interest 0  Down,  Depressed, Hopeless 0  PHQ - 2 Score 0    No flowsheet data found.    Past Medical History:  Diagnosis Date  . Kidney stones   . OSA (obstructive sleep apnea)   . Postural dizziness    Past Surgical History:  Procedure Laterality Date  . KNEE SURGERY  2011  . TONSILLECTOMY AND ADENOIDECTOMY     For snoring.    Social History   Tobacco Use  . Smoking status: Former Smoker    Packs/day: 0.50    Years: 20.00    Pack years: 10.00    Last attempt to quit: 01/13/2012    Years since quitting: 5.5  . Smokeless tobacco: Never Used  Substance Use Topics  . Alcohol use: Yes    Alcohol/week: 0.5 - 2.5 oz    Types: 1 - 5 drink(s) per week    Comment: Rarely   family history includes Colon polyps in his mother; Diabetes in his mother; Hypertension in his mother; Lymphoma (age of onset: 26) in his paternal grandfather; Prostate cancer in his paternal grandfather; Stomach cancer (age of onset: 3) in his father; Stroke (age of onset: 59) in his mother.    ROS: negative except as noted in the HPI  Medications: No current outpatient medications on file.   No current facility-administered medications  for this visit.    Allergies  Allergen Reactions  . Linzess [Linaclotide] Nausea Only       Objective:  BP 139/87   Pulse 90   Temp 97.9 F (36.6 C) (Oral)   Wt (!) 327 lb (148.3 kg)   BMI 39.80 kg/m  Gen:  alert, not ill-appearing, no distress, appropriate for age, obese male HEENT: head normocephalic without obvious abnormality, conjunctiva and cornea clear, wearing glasses, trachea midline, Pulm: Normal work of breathing, normal phonation, clear to auscultation bilaterally, no wheezes, rales or rhonchi CV: Normal rate, regular rhythm, s1 and s2 distinct, no murmurs, clicks or rubs;  no carotid bruit, radial pulses 2+ symmetric Neuro: alert and oriented x 3, no tremor MSK: extremities atraumatic, normal gait and station, no peripheral edema Skin: intact, scattered annual  erythematous plaques with fine scale on left upper extremity and left flank/abdomen Psych: well-groomed, cooperative, good eye contact, euthymic mood, affect mood-congruent, speech is articulate, and thought processes clear and goal-directed    No results found for this or any previous visit (from the past 72 hour(s)). No results found.    Assessment and Plan: 47 y.o. male with   1. Heart palpitations - diagnosis is broad and includes noncardiac causes (anemia, anxiety, electrolyte imbalance/hypervolemia, hyperthyroidism) and cardiac dysrhythmia (paroxysmal afib, sick-sinus syndrome, SVT). - personally reviewed prior cardiac work-up including 24-hr holter and ETT. Symptoms have changed since last holter in 2015. Recommend cardiac event monitor, since symptoms are not persistent - TSH + free T4 - CBC - Comprehensive metabolic panel - Cardiac event monitor; Future  2. Orthostasis BP Readings from Last 3 Encounters:  07/23/17 139/87  04/02/17 118/76  03/05/17 123/77  - BP elevated in office today   3. Postural dizziness with presyncope - chronic, unchanged  4. History of cardiovascular stress test - 03/30/2014 Exercise Stress test negative  5. Screening for thyroid disorder - TSH + free T4  6. Screening for blood disease - CBC - Comprehensive metabolic panel  7. Screening for blood or protein in urine - Urinalysis, microscopic only  8. Screening for diabetes mellitus - Comprehensive metabolic panel  9. Screening for lipid disorders - Lipid Panel w/reflex Direct LDL  10. Obstructive sleep apnea of adult - sleep study >10 years ago, records unavailable. He was treated surgically, but is not on CPAP therapy. Endorses snoring and apnea. Positive STOPBANG - Home sleep test; Future  11. History of nephrolithiasis - denies symptoms of renal colic today - US RENAL - Urinalysis, microscopic only  12. Tinea corporis - clotrimazole (LOTRIMIN) 1 % cream; Apply 1  application topically 2 (two) times daily.  [facility] discharge follow-up - requested records from [facility] today   Patient education and anticipatory guidance given Patient agrees with treatment plan Follow-up in 3 weeks or sooner as needed if symptoms worsen or fail to improve  I spent 40 minutes with this patient, greater than 50% was face-to-face time counseling regarding the above diagnoses  Darlyne Russian PA-C

## 2017-07-24 DIAGNOSIS — Z130 Encounter for screening for diseases of the blood and blood-forming organs and certain disorders involving the immune mechanism: Secondary | ICD-10-CM | POA: Diagnosis not present

## 2017-07-24 DIAGNOSIS — Z131 Encounter for screening for diabetes mellitus: Secondary | ICD-10-CM | POA: Diagnosis not present

## 2017-07-24 DIAGNOSIS — R002 Palpitations: Secondary | ICD-10-CM | POA: Diagnosis not present

## 2017-07-24 DIAGNOSIS — Z1329 Encounter for screening for other suspected endocrine disorder: Secondary | ICD-10-CM | POA: Diagnosis not present

## 2017-07-25 ENCOUNTER — Encounter: Payer: Self-pay | Admitting: Physician Assistant

## 2017-07-25 ENCOUNTER — Ambulatory Visit (INDEPENDENT_AMBULATORY_CARE_PROVIDER_SITE_OTHER): Payer: BLUE CROSS/BLUE SHIELD

## 2017-07-25 DIAGNOSIS — R739 Hyperglycemia, unspecified: Secondary | ICD-10-CM | POA: Insufficient documentation

## 2017-07-25 DIAGNOSIS — Z87442 Personal history of urinary calculi: Secondary | ICD-10-CM | POA: Diagnosis not present

## 2017-07-25 DIAGNOSIS — N200 Calculus of kidney: Secondary | ICD-10-CM | POA: Diagnosis not present

## 2017-07-25 NOTE — Progress Notes (Signed)
Sharyn Lull, can you call [facility] to add-on a HgbA1c?  Labs look good overall - blood sugar is high. Recommend we check an A1c to rule out prediabetes - normal kidney function - cholesterol in a healthy range - normal blood counts - no evidence of thyroid disease  Best, Evlyn Clines

## 2017-07-27 NOTE — Progress Notes (Signed)
Hi [place],  Your kidneys are normal in size and structurally normal. The left is slightly larger than the right, which is normal.  Best, Evlyn Clines

## 2017-07-29 ENCOUNTER — Encounter: Payer: Self-pay | Admitting: Physician Assistant

## 2017-07-29 DIAGNOSIS — R7303 Prediabetes: Secondary | ICD-10-CM | POA: Insufficient documentation

## 2017-07-29 HISTORY — DX: Prediabetes: R73.03

## 2017-07-29 LAB — COMPREHENSIVE METABOLIC PANEL
AG Ratio: 1.5 (calc) (ref 1.0–2.5)
ALT: 32 U/L (ref 9–46)
AST: 21 U/L (ref 10–40)
Albumin: 4.5 g/dL (ref 3.6–5.1)
Alkaline phosphatase (APISO): 61 U/L (ref 40–115)
BUN: 18 mg/dL (ref 7–25)
CHLORIDE: 103 mmol/L (ref 98–110)
CO2: 27 mmol/L (ref 20–32)
Calcium: 9.8 mg/dL (ref 8.6–10.3)
Creat: 1.15 mg/dL (ref 0.60–1.35)
GLOBULIN: 3 g/dL (ref 1.9–3.7)
GLUCOSE: 139 mg/dL — AB (ref 65–99)
Potassium: 4.5 mmol/L (ref 3.5–5.3)
SODIUM: 137 mmol/L (ref 135–146)
TOTAL PROTEIN: 7.5 g/dL (ref 6.1–8.1)
Total Bilirubin: 0.6 mg/dL (ref 0.2–1.2)

## 2017-07-29 LAB — CBC
HCT: 44.9 % (ref 38.5–50.0)
Hemoglobin: 15.4 g/dL (ref 13.2–17.1)
MCH: 29.4 pg (ref 27.0–33.0)
MCHC: 34.3 g/dL (ref 32.0–36.0)
MCV: 85.7 fL (ref 80.0–100.0)
MPV: 11 fL (ref 7.5–12.5)
Platelets: 282 10*3/uL (ref 140–400)
RBC: 5.24 10*6/uL (ref 4.20–5.80)
RDW: 13.1 % (ref 11.0–15.0)
WBC: 10.6 10*3/uL (ref 3.8–10.8)

## 2017-07-29 LAB — LIPID PANEL W/REFLEX DIRECT LDL
CHOL/HDL RATIO: 4.7 (calc) (ref <5.0)
Cholesterol: 150 mg/dL (ref <200)
HDL: 32 mg/dL — ABNORMAL LOW (ref >40)
LDL Cholesterol (Calc): 95 mg/dL (calc)
NON-HDL CHOLESTEROL (CALC): 118 mg/dL (ref <130)
TRIGLYCERIDES: 131 mg/dL (ref <150)

## 2017-07-29 LAB — TSH+FREE T4: TSH W/REFLEX TO FT4: 2.52 mIU/L (ref 0.40–4.50)

## 2017-07-29 LAB — URINALYSIS, MICROSCOPIC ONLY
Bacteria, UA: NONE SEEN /HPF
Hyaline Cast: NONE SEEN /LPF
RBC / HPF: NONE SEEN /HPF (ref 0–2)
Squamous Epithelial / LPF: NONE SEEN /HPF (ref <=5)
WBC, UA: NONE SEEN /HPF (ref 0–5)

## 2017-07-29 LAB — TEST AUTHORIZATION

## 2017-07-29 LAB — HEMOGLOBIN A1C W/OUT EAG: HEMOGLOBIN A1C: 6.2 %{Hb} — AB (ref <5.7)

## 2017-07-29 NOTE — Progress Notes (Signed)
Good morning Adam Hamilton,  Your labs show prediabetes. This is a form of insulin resistance that can lead to type 2 diabetes. Recommend low-carb ADA diet (45 g per meal), aerobic exercise at least 2 hours per week, and weight loss. Follow-up with Dr. Madilyn Fireman at least every 6 months to check your labs.  Best, Evlyn Clines

## 2017-08-11 ENCOUNTER — Encounter: Payer: Self-pay | Admitting: Physician Assistant

## 2017-08-11 DIAGNOSIS — B354 Tinea corporis: Secondary | ICD-10-CM

## 2017-08-11 MED ORDER — TERBINAFINE HCL 250 MG PO TABS: 250.0000 mg | ORAL_TABLET | Freq: Every day | ORAL | 0 refills | Status: AC

## 2017-08-13 ENCOUNTER — Ambulatory Visit: Payer: BLUE CROSS/BLUE SHIELD | Admitting: Physician Assistant

## 2017-08-18 ENCOUNTER — Ambulatory Visit (INDEPENDENT_AMBULATORY_CARE_PROVIDER_SITE_OTHER): Payer: BLUE CROSS/BLUE SHIELD

## 2017-08-18 DIAGNOSIS — R002 Palpitations: Secondary | ICD-10-CM

## 2017-08-21 DIAGNOSIS — G4730 Sleep apnea, unspecified: Secondary | ICD-10-CM | POA: Diagnosis not present

## 2017-08-28 ENCOUNTER — Encounter: Payer: Self-pay | Admitting: Physician Assistant

## 2017-08-28 DIAGNOSIS — G4734 Idiopathic sleep related nonobstructive alveolar hypoventilation: Secondary | ICD-10-CM

## 2017-08-29 ENCOUNTER — Encounter: Payer: Self-pay | Admitting: Physician Assistant

## 2017-08-29 DIAGNOSIS — G4734 Idiopathic sleep related nonobstructive alveolar hypoventilation: Secondary | ICD-10-CM

## 2017-08-29 HISTORY — DX: Idiopathic sleep related nonobstructive alveolar hypoventilation: G47.34

## 2017-08-29 NOTE — Telephone Encounter (Signed)
Reviewed results of sleep study  Explained pathophysiology and treatment of OSA Answered patient's questions He is going to call back [facility] and proceed with CPAP therapy

## 2017-09-02 DIAGNOSIS — H18832 Recurrent erosion of cornea, left eye: Secondary | ICD-10-CM | POA: Diagnosis not present

## 2017-09-02 DIAGNOSIS — H02883 Meibomian gland dysfunction of right eye, unspecified eyelid: Secondary | ICD-10-CM | POA: Diagnosis not present

## 2017-09-02 DIAGNOSIS — H43812 Vitreous degeneration, left eye: Secondary | ICD-10-CM | POA: Diagnosis not present

## 2017-09-02 DIAGNOSIS — H35412 Lattice degeneration of retina, left eye: Secondary | ICD-10-CM | POA: Diagnosis not present

## 2017-09-03 DIAGNOSIS — G4733 Obstructive sleep apnea (adult) (pediatric): Secondary | ICD-10-CM | POA: Diagnosis not present

## 2017-09-05 DIAGNOSIS — H02886 Meibomian gland dysfunction of left eye, unspecified eyelid: Secondary | ICD-10-CM | POA: Diagnosis not present

## 2017-09-05 DIAGNOSIS — H43812 Vitreous degeneration, left eye: Secondary | ICD-10-CM | POA: Diagnosis not present

## 2017-09-05 DIAGNOSIS — H02883 Meibomian gland dysfunction of right eye, unspecified eyelid: Secondary | ICD-10-CM | POA: Diagnosis not present

## 2017-09-05 DIAGNOSIS — H18832 Recurrent erosion of cornea, left eye: Secondary | ICD-10-CM | POA: Diagnosis not present

## 2017-09-11 DIAGNOSIS — H43812 Vitreous degeneration, left eye: Secondary | ICD-10-CM | POA: Diagnosis not present

## 2017-09-11 DIAGNOSIS — H35412 Lattice degeneration of retina, left eye: Secondary | ICD-10-CM | POA: Diagnosis not present

## 2017-09-11 DIAGNOSIS — H18832 Recurrent erosion of cornea, left eye: Secondary | ICD-10-CM | POA: Diagnosis not present

## 2017-09-11 DIAGNOSIS — H02883 Meibomian gland dysfunction of right eye, unspecified eyelid: Secondary | ICD-10-CM | POA: Diagnosis not present

## 2017-09-21 DIAGNOSIS — Z9889 Other specified postprocedural states: Secondary | ICD-10-CM | POA: Insufficient documentation

## 2017-09-23 NOTE — Progress Notes (Signed)
Hi Adam Hamilton,  Good news, your heart monitor did not show any abnormal heart rhythms.   If you are still having symptomatic palpitations I would recommend discussing a beta blocker with Dr. Madilyn Fireman.  Best, Evlyn Clines

## 2017-10-04 DIAGNOSIS — G4733 Obstructive sleep apnea (adult) (pediatric): Secondary | ICD-10-CM | POA: Diagnosis not present

## 2017-10-08 ENCOUNTER — Encounter: Payer: Self-pay | Admitting: Family Medicine

## 2017-10-13 ENCOUNTER — Telehealth: Payer: Self-pay | Admitting: Family Medicine

## 2017-10-13 ENCOUNTER — Encounter: Payer: Self-pay | Admitting: Family Medicine

## 2017-10-13 ENCOUNTER — Ambulatory Visit (INDEPENDENT_AMBULATORY_CARE_PROVIDER_SITE_OTHER): Payer: BLUE CROSS/BLUE SHIELD | Admitting: Family Medicine

## 2017-10-13 VITALS — BP 138/75 | HR 74 | Ht 76.0 in | Wt 328.0 lb

## 2017-10-13 DIAGNOSIS — R0789 Other chest pain: Secondary | ICD-10-CM

## 2017-10-13 DIAGNOSIS — G4733 Obstructive sleep apnea (adult) (pediatric): Secondary | ICD-10-CM

## 2017-10-13 DIAGNOSIS — G4734 Idiopathic sleep related nonobstructive alveolar hypoventilation: Secondary | ICD-10-CM

## 2017-10-13 MED ORDER — AMBULATORY NON FORMULARY MEDICATION: 0 refills | Status: DC

## 2017-10-13 NOTE — Progress Notes (Signed)
Subjective:    Patient ID: Adam Hamilton, male    DOB: 19-Oct-1970, 47 y.o.   MRN: [id number]  HPI 47 year old male was recently diagnosed with obstructive sleep apnea.  He is getting equipment and supplies through [employer].  He says initially he started with the full facemask for a couple of nights and really just did not tolerate it well to switch to the nasal pillows.  He is actually been doing much better with that.  He stated around the third week he got frustrated and almost gave up doing it but decided to stick with it.  He has been very consistent.  He showed me the download on his phone today.  Most all of his AHI's are under 1.  He only had one that was 1.2.  He did have a few nights with a fair amount of leaks but then he had other nights with 0.  He is been wearing it consistently for at least a minimum of 4 hours nightly and for most nights between 6 to 8 hours.  He says that they did recommend repeating an overnight pulse oximetry study just to make sure that the hypoxemia has been corrected.  His lowest desaturation during his home study was 75% and he did spend 35 minutes less than 88% overnight.  He was also recently diagnosed with prediabetes. Lab Results  Component Value Date   HGBA1C 6.2 (H) 07/24/2017     All of this work-up began after emergency department visit for chest pain when he thought he was having a heart attack.  He still is not exactly sure what caused the chest pain but he does report that around that time he thinks that he may have accidentally been given a energy latte at [employer].  He says he normally orders a certain type of latte and then realized it actually takes different that day.  He later found out that they were actually selling and energy version of that particular latte and wonders if he may have accidentally gotten that one and it may have actually caused the increased heart rate and blood pressure that day.   Review of Systems  BP 138/75    Pulse 74   Ht 6\' 4"  (1.93 m)   Wt (!) 328 lb (148.8 kg)   SpO2 99%   BMI 39.93 kg/m     Allergies  Allergen Reactions  . Linzess [Linaclotide] Nausea Only    Past Medical History:  Diagnosis Date  . Fatty liver 07/23/2017   Demonstrated on RUQ [place]  . Kidney stones   . Nocturnal hypoxia 08/29/2017  . OSA (obstructive sleep apnea)   . Postural dizziness   . Prediabetes 07/29/2017    Past Surgical History:  Procedure Laterality Date  . KNEE SURGERY  2011  . LITHOTRIPSY     age 64  . PALATE SURGERY     for snoring  . TONSILLECTOMY AND ADENOIDECTOMY     For snoring.     Social History   Socioeconomic History  . Marital status: Married    Spouse name: Angie  . Number of children: 3  . Years of education: 2  . Highest education level: Not on file  Occupational History  . Occupation: Educational psychologist    Comment: [employer]  . Financial resource strain: Not on file  . Food insecurity:    Worry: Not on file    Inability: Not on file  . Transportation needs:  Medical: Not on file    Non-medical: Not on file  Tobacco Use  . Smoking status: Former Smoker    Packs/day: 0.50    Years: 20.00    Pack years: 10.00    Last attempt to quit: 01/13/2012    Years since quitting: 5.7  . Smokeless tobacco: Never Used  Substance and Sexual Activity  . Alcohol use: Yes    Alcohol/week: 0.5 - 2.5 oz    Types: 1 - 5 drink(s) per week    Comment: Rarely  . Drug use: No  . Sexual activity: Yes  Lifestyle  . Physical activity:    Days per week: Not on file    Minutes per session: Not on file  . Stress: Not on file  Relationships  . Social connections:    Talks on phone: Not on file    Gets together: Not on file    Attends religious service: Not on file    Active member of club or organization: Not on file    Attends meetings of clubs or organizations: Not on file    Relationship status: Not on file  . Intimate partner violence:     Fear of current or ex partner: Not on file    Emotionally abused: Not on file    Physically abused: Not on file    Forced sexual activity: Not on file  Other Topics Concern  . Not on file  Social History Narrative   2-3 caffein drinks per day. Some regular exercise.     Family History  Problem Relation Age of Onset  . Stroke Mother 51  . Hypertension Mother   . Colon polyps Mother   . Pulmonary fibrosis Mother   . Diabetes type I Mother   . Stomach cancer Father 56  . Lymphoma Paternal Grandfather 34  . Prostate cancer Paternal Grandfather   . Heart attack Neg Hx   . Hyperlipidemia Neg Hx   . Sudden death Neg Hx     Outpatient Encounter Medications as of 10/13/2017  Medication Sig  . Polyethyl Glycol-Propyl Glycol (SYSTANE OP) Apply to eye.  . prednisoLONE acetate (PRED FORTE) 1 % ophthalmic suspension Place 1 drop into both eyes 4 (four) times daily.  . [DISCONTINUED] ofloxacin (OCUFLOX) 0.3 % ophthalmic solution Place 1 drop into the left eye 4 (four) times daily.  . AMBULATORY NON FORMULARY MEDICATION Medication Name: please perform overnight pulse ox now that he is on CPAP. Fax to [facility].  . [DISCONTINUED] clotrimazole (LOTRIMIN) 1 % cream Apply 1 application topically 2 (two) times daily.   No facility-administered encounter medications on file as of 10/13/2017.          Objective:   Physical Exam  Constitutional: He is oriented to person, place, and time. He appears well-developed and well-nourished.  HENT:  Head: Normocephalic and atraumatic.  Cardiovascular: Normal rate, regular rhythm and normal heart sounds.  Pulmonary/Chest: Effort normal and breath sounds normal.  Neurological: He is alert and oriented to person, place, and time.  Skin: Skin is warm and dry.  Psychiatric: He has a normal mood and affect. His behavior is normal.          Assessment & Plan:  OSA on CPAP -we discussed the importance of treating sleep apnea and the long-term health  risks of not treating it.  So far he is actually doing really well the nasal pillows will call to get a up-to-date download on his CPAP so that we can go ahead and  get his pressure set and take him off of [employer].  If he is otherwise doing well then I will see him back in 6 months.  He will need to contact the company for new supplies intermittently though sometimes I will send him automatically.  He is using the CPAP consistently and is benefiting significantly and correcting his sleep apnea.  We will get home overnight sleep study performed just to make sure that the desaturations have corrected as well.  IFG - due to recheck in 6 months.   Atypical chest pain - was likely from the energy drink.  Feeling much better.  Pressure and pulse are much better.

## 2017-10-13 NOTE — Telephone Encounter (Signed)
Please call [employer] and have them fax a download from his CPAP. Currently set on [employer]

## 2017-10-14 NOTE — Telephone Encounter (Signed)
Order given to [facility] F.to fax to [employer].Adam Hamilton, [place]

## 2017-10-15 ENCOUNTER — Encounter: Payer: Self-pay | Admitting: Family Medicine

## 2017-10-15 DIAGNOSIS — R0902 Hypoxemia: Secondary | ICD-10-CM | POA: Diagnosis not present

## 2017-10-15 DIAGNOSIS — J449 Chronic obstructive pulmonary disease, unspecified: Secondary | ICD-10-CM | POA: Diagnosis not present

## 2017-10-30 ENCOUNTER — Telehealth: Payer: Self-pay | Admitting: Family Medicine

## 2017-10-30 DIAGNOSIS — G4733 Obstructive sleep apnea (adult) (pediatric): Secondary | ICD-10-CM

## 2017-10-30 NOTE — Telephone Encounter (Signed)
Call patient: I did receive a download from his CPAP from [facility] in [place].  Overnight oxygen actually looks really good.  So it looks like by wearing his CPAP his hypoxemia has been corrected.

## 2017-10-30 NOTE — Telephone Encounter (Signed)
Pt wants to know if he needs to continue with concentrator also?  He uses it nightly at home but did not use it for the overnight.  Also, wants to know if pressure is going to be changed?

## 2017-10-31 NOTE — Telephone Encounter (Signed)
They only did an overnight pulse ox so that doesn't tell me to adjust the pressure. But we can request a CPAP download to do that. If he wasn't using the concentrator the night of the test the yes, he doesn't need it as long as he is wearing his CPAP

## 2017-10-31 NOTE — Telephone Encounter (Signed)
lvm informing pt that there was NOT a download for a CPAP. And that Dr. Madilyn Fireman ONLY received the overnight pulse ox. I informed him that we will need to contact the company about this and call him back to let him know the next steps.Adam Hamilton, [place]

## 2017-10-31 NOTE — Telephone Encounter (Signed)
Called pt and informed him of recommendations. He stated that when he did the actual testing the instructions stated for him NOT to use the supplemental O2 for testing, so he has NOT been using the concentrator. Also he would like to know since you receive the download what is his pressure/fixed pressure supposed to be.   Fwd to pcp for review.Elouise Munroe, [place]

## 2017-10-31 NOTE — Telephone Encounter (Signed)
Called [facility] and lvm asking that they fax over the results from his CPAP and that we only received an overnight pulse ox. Maryruth Eve, [place], CMA

## 2017-11-03 DIAGNOSIS — G4733 Obstructive sleep apnea (adult) (pediatric): Secondary | ICD-10-CM | POA: Diagnosis not present

## 2017-11-05 NOTE — Telephone Encounter (Signed)
Received compliance report for pt. Will place in Dr. Gardiner Ramus box for review.Adam Hamilton, [name]

## 2017-11-10 MED ORDER — AMBULATORY NON FORMULARY MEDICATION: 0 refills | Status: DC

## 2017-11-10 NOTE — Telephone Encounter (Signed)
Call patient: I did receive his compliance report from [employer] for his CPAP.  Is been in doing a great job of using his CPAP every day and for greater than 4 hours each day.  It looks like his pressure needs to be set to 9 cm of water pressure.  Will fax over to [facility].

## 2017-11-10 NOTE — Telephone Encounter (Signed)
Order faxed to [facility] care, confirmation received. Adam Hamilton, [place]

## 2017-12-04 DIAGNOSIS — G4733 Obstructive sleep apnea (adult) (pediatric): Secondary | ICD-10-CM | POA: Diagnosis not present

## 2018-01-03 DIAGNOSIS — G4733 Obstructive sleep apnea (adult) (pediatric): Secondary | ICD-10-CM | POA: Diagnosis not present

## 2018-02-03 DIAGNOSIS — G4733 Obstructive sleep apnea (adult) (pediatric): Secondary | ICD-10-CM | POA: Diagnosis not present

## 2018-02-24 ENCOUNTER — Encounter: Payer: Self-pay | Admitting: *Deleted

## 2018-02-24 ENCOUNTER — Emergency Department (INDEPENDENT_AMBULATORY_CARE_PROVIDER_SITE_OTHER)
Admission: EM | Admit: 2018-02-24 | Discharge: 2018-02-24 | Disposition: A | Discharge status: Other Acute Inpatient Hospital | Payer: BLUE CROSS/BLUE SHIELD | Source: Home / Self Care | Attending: Family Medicine | Admitting: Family Medicine

## 2018-02-24 ENCOUNTER — Other Ambulatory Visit: Payer: Self-pay

## 2018-02-24 ENCOUNTER — Telehealth: Payer: BLUE CROSS/BLUE SHIELD | Admitting: Nurse Practitioner

## 2018-02-24 DIAGNOSIS — K820 Obstruction of gallbladder: Secondary | ICD-10-CM | POA: Diagnosis not present

## 2018-02-24 DIAGNOSIS — Z87891 Personal history of nicotine dependence: Secondary | ICD-10-CM | POA: Diagnosis not present

## 2018-02-24 DIAGNOSIS — R109 Unspecified abdominal pain: Secondary | ICD-10-CM

## 2018-02-24 DIAGNOSIS — Z888 Allergy status to other drugs, medicaments and biological substances status: Secondary | ICD-10-CM | POA: Diagnosis not present

## 2018-02-24 DIAGNOSIS — R1032 Left lower quadrant pain: Secondary | ICD-10-CM

## 2018-02-24 DIAGNOSIS — R7989 Other specified abnormal findings of blood chemistry: Secondary | ICD-10-CM | POA: Diagnosis not present

## 2018-02-24 DIAGNOSIS — K5790 Diverticulosis of intestine, part unspecified, without perforation or abscess without bleeding: Secondary | ICD-10-CM | POA: Diagnosis not present

## 2018-02-24 DIAGNOSIS — K449 Diaphragmatic hernia without obstruction or gangrene: Secondary | ICD-10-CM | POA: Diagnosis not present

## 2018-02-24 NOTE — Discharge Instructions (Signed)
°  Please drive directly to [facility] Emergency Department for further evaluation and treatment of your abdominal pain.   Do not eat or drink anything on the way there. They will likely give IV pain medication, obtain labs this evening and get a CT scan of your belly. If needed, that hospital is able to perform surgery.  You have declined EMS transport from [facility]. If needed please pull over and call 911.

## 2018-02-24 NOTE — ED Triage Notes (Signed)
Pt c/o LLQ abd pain x 3 days with intermittent nausea. The pain is worsening. Denies vomiting. Normal BM. Took Aleve at 1500.

## 2018-02-24 NOTE — ED Provider Notes (Signed)
[facility]    CSN: [id number] Arrival date & time: 02/24/18  1921     History   Chief Complaint Chief Complaint  Patient presents with  . Abdominal Pain    HPI Adam Hamilton is a 47 y.o. male.   HPI Adam Hamilton is a 47 y.o. male presenting to UC with c/o severe LLQ abdominal pain that has gradually worsened over the last 3 days.  Pain is sharp in nature.  It has since become constant today, worse with any movement, even riding in a car.  Denies nausea at this time but has had some.  No vomiting or diarrhea. He's been having normal bowel movements. He took Aleve around 1500 but no relief.  No hx of abdominal surgeries.  Hx of similar abdominal pain per his PMH in 2014 but no source of pain was ever found. He had a colonoscopy, which showed diverticulosis but no diverticulitis. He does still have his appendix.    Past Medical History:  Diagnosis Date  . Fatty liver 07/23/2017   Demonstrated on RUQ [place]  . Kidney stones   . Nocturnal hypoxia 08/29/2017  . OSA (obstructive sleep apnea)   . Postural dizziness   . Prediabetes 07/29/2017    Patient Active Problem List   Diagnosis Date Noted  . S/P ASA (advanced surface ablation) surgery PRK (photorefractive keratectomy) 09/21/2017  . 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 Nocturnal hypoxia 08/29/2017  . Prediabetes 07/29/2017  . Hyperglycemia 07/25/2017  . Tinea corporis 07/23/2017  . Postural dizziness with presyncope 07/23/2017  . Heart palpitations 07/23/2017  . Orthostasis 07/23/2017  . Fatty liver 07/23/2017  . History of cardiovascular stress test 07/23/2017  . Obstructive sleep apnea of adult 07/23/2017  . History of nephrolithiasis 07/23/2017  . Myopia with astigmatism and presbyopia, bilateral 03/10/2017  . CN (constipation) 08/01/2014  . Umbilical hernia without obstruction and without gangrene 08/01/2014  . Obesity 03/21/2014  . Change in bowel function 07/08/2013  . Rectal bleeding  07/08/2013  . Dizzy spells 12/26/2010    Past Surgical History:  Procedure Laterality Date  . KNEE SURGERY  2011  . LITHOTRIPSY     age 89  . PALATE SURGERY     for snoring  . TONSILLECTOMY AND ADENOIDECTOMY     For snoring.        Home Medications    Prior to Admission medications   Medication Sig Start Date End Date Taking? Authorizing Provider  AMBULATORY NON FORMULARY MEDICATION Medication Name: please perform overnight pulse ox now that he is on CPAP. Fax to [facility]. 10/13/17   Hali Marry, MD  AMBULATORY NON FORMULARY MEDICATION Medication Name: CPAP set to 9 cm water pressure and repeat download in 30 days and fax to use at [phone].  FAx to [facility]. 11/10/17   Hali Marry, MD  Polyethyl Glycol-Propyl Glycol (SYSTANE OP) Apply to eye.    [provider]  prednisoLONE acetate (PRED FORTE) 1 % ophthalmic suspension Place 1 drop into both eyes 4 (four) times daily. 09/27/17   [provider]    Family History Family History  Problem Relation Age of Onset  . Stroke Mother 49  . Hypertension Mother   . Colon polyps Mother   . Pulmonary fibrosis Mother   . Diabetes type I Mother   . Stomach cancer Father 54  . Lymphoma Paternal Grandfather 39  . Prostate cancer Paternal Grandfather   . Heart attack Neg Hx   .  Hyperlipidemia Neg Hx   . Sudden death Neg Hx     Social History Social History   Tobacco Use  . Smoking status: Former Smoker    Packs/day: 0.50    Years: 20.00    Pack years: 10.00    Last attempt to quit: 01/13/2012    Years since quitting: 6.1  . Smokeless tobacco: Never Used  Substance Use Topics  . Alcohol use: Yes    Alcohol/week: 1.0 - 5.0 standard drinks    Types: 1 - 5 drink(s) per week    Comment: Rarely  . Drug use: No     Allergies   Linzess [linaclotide]   Review of Systems Review of Systems  Constitutional: Negative for chills and fever.  Gastrointestinal: Positive for abdominal pain and  nausea. Negative for blood in stool, constipation, diarrhea and vomiting.  Genitourinary: Negative for dysuria, frequency and hematuria.  Musculoskeletal: Negative for back pain.     Physical Exam Triage Vital Signs ED Triage Vitals  Enc Vitals Group     BP 02/24/18 1936 109/71     Pulse Rate 02/24/18 1936 81     Resp 02/24/18 1936 18     Temp 02/24/18 1936 97.6 F (36.4 C)     Temp Source 02/24/18 1936 Oral     SpO2 02/24/18 1936 97 %     Weight 02/24/18 1937 294 lb (133.4 kg)     Height 02/24/18 1937 6\' 4"  (1.93 m)     Head Circumference --      Peak Flow --      Pain Score 02/24/18 1937 9     Pain Loc --      Pain Edu? --      Excl. in [place]? --    No data found.  Updated Vital Signs BP 109/71 (BP Location: Right Arm)   Pulse 81   Temp 97.6 F (36.4 C) (Oral)   Resp 18   Ht 6\' 4"  (1.93 m)   Wt 294 lb (133.4 kg)   SpO2 97%   BMI 35.79 kg/m   Visual Acuity Right Eye Distance:   Left Eye Distance:   Bilateral Distance:    Right Eye Near:   Left Eye Near:    Bilateral Near:     Physical Exam  Constitutional: He is oriented to person, place, and time. He appears well-developed and well-nourished.  Non-toxic appearance. He does not appear ill.  HENT:  Head: Normocephalic and atraumatic.  Eyes: EOM are normal.  Neck: Normal range of motion.  Cardiovascular: Normal rate and regular rhythm.  Pulmonary/Chest: Effort normal and breath sounds normal.  Abdominal: There is tenderness in the left lower quadrant. There is guarding. There is no rigidity, no rebound and no CVA tenderness.  Musculoskeletal: Normal range of motion.  Neurological: He is alert and oriented to person, place, and time.  Skin: Skin is warm and dry.  Psychiatric: He has a normal mood and affect. His behavior is normal.  Nursing note and vitals reviewed.    UC Treatments / Results  Labs (all labs ordered are listed, but only abnormal results are displayed) Labs Reviewed - No data to  display  EKG None  Radiology No results found.  Procedures Procedures (including critical care time)  Medications Ordered in UC Medications - No data to display  Initial Impression / Assessment and Plan / UC Course  I have reviewed the triage vital signs and the nursing notes.  Pertinent labs & imaging results that were  available during my care of the patient were reviewed by me and considered in my medical decision making (see chart for details).     Worsening severe LLQ abdominal pain. Advised pt he needs to go to ED for further evaluation. Pt declined EMS transport. Will drive POV to [facility] center.  Final Clinical Impressions(s) / UC Diagnoses   Final diagnoses:  LLQ abdominal pain     Discharge Instructions      Please drive directly to [facility] Emergency Department for further evaluation and treatment of your abdominal pain.   Do not eat or drink anything on the way there. They will likely give IV pain medication, obtain labs this evening and get a CT scan of your belly. If needed, that hospital is able to perform surgery.  You have declined EMS transport from [facility]. If needed please pull over and call 911.    ED Prescriptions    None     Controlled Substance Prescriptions [place] Controlled Substance Registry consulted? Not Applicable   Tyrell Antonio 02/26/18 1058

## 2018-02-24 NOTE — Progress Notes (Signed)
Based on what you shared with me it looks like you have a serious condition that should be evaluated in a face to face office visit. This does sound like diverticulitis which can not be treated in an evisit. You can use imodium AD oTC for diarrhea. Really need to see your PCP or go to urgent care.  NOTE: If you entered your credit card information for this eVisit, you will not be charged. You may see a "hold" on your card for the $30 but that hold will drop off and you will not have a charge processed.  If you are having a true medical emergency please call 911.  If you need an urgent face to face visit, [place] has four urgent care centers for your convenience.  If you need care fast and have a high deductible or no insurance consider:   [email or web address] to reserve your spot online an avoid wait times  [facility] [address] [place], [place] [address] 8 am to 8 pm Monday-Friday 10 am to 4 pm Saturday-Sunday *Across the street from [employer]  [place], [address] 8 am to 5 pm Monday-Friday * In the [facility] on the [facility]   The following sites will take your  insurance:  . [facility] Urgent [place] a Provider at this Location  [address] [place], [place] [address] . 10 am to 8 pm Monday-Friday . 12 pm to 8 pm Saturday-Sunday   . [facility] at [place] a Provider at this Location  [address], [place], [place] [address] . 8 am to 8 pm Monday-Friday . 9 am to 6 pm Saturday . 11 am to 6 pm Sunday   . [facility]gent [facility] at [place] Get Driving Directions  [address] [place], [place] [address] . 8 am to 8 pm Monday-Friday . 8 am to 4 pm Saturday-Sunday   Your e-visit answers were reviewed by a  board certified advanced clinical practitioner to complete your personal care plan.  Thank you for using e-Visits.

## 2018-02-26 ENCOUNTER — Telehealth: Payer: Self-pay | Admitting: Emergency Medicine

## 2018-02-26 NOTE — Telephone Encounter (Signed)
Patient is feeling a lot bettter!

## 2018-03-02 DIAGNOSIS — G4733 Obstructive sleep apnea (adult) (pediatric): Secondary | ICD-10-CM | POA: Diagnosis not present

## 2018-03-06 DIAGNOSIS — G4733 Obstructive sleep apnea (adult) (pediatric): Secondary | ICD-10-CM | POA: Diagnosis not present

## 2018-03-13 DIAGNOSIS — H8110 Benign paroxysmal vertigo, unspecified ear: Secondary | ICD-10-CM | POA: Diagnosis not present

## 2018-03-13 DIAGNOSIS — R42 Dizziness and giddiness: Secondary | ICD-10-CM | POA: Diagnosis not present

## 2018-03-13 DIAGNOSIS — G4730 Sleep apnea, unspecified: Secondary | ICD-10-CM | POA: Diagnosis not present

## 2018-03-13 DIAGNOSIS — Z87891 Personal history of nicotine dependence: Secondary | ICD-10-CM | POA: Diagnosis not present

## 2018-03-13 DIAGNOSIS — Z888 Allergy status to other drugs, medicaments and biological substances status: Secondary | ICD-10-CM | POA: Diagnosis not present

## 2018-03-13 DIAGNOSIS — Z87442 Personal history of urinary calculi: Secondary | ICD-10-CM | POA: Diagnosis not present

## 2018-03-13 DIAGNOSIS — R0602 Shortness of breath: Secondary | ICD-10-CM | POA: Diagnosis not present

## 2018-03-16 ENCOUNTER — Telehealth: Payer: Self-pay

## 2018-03-16 DIAGNOSIS — R42 Dizziness and giddiness: Secondary | ICD-10-CM

## 2018-03-16 DIAGNOSIS — H9319 Tinnitus, unspecified ear: Secondary | ICD-10-CM

## 2018-03-16 NOTE — Telephone Encounter (Signed)
Referral sent. Patient aware

## 2018-03-16 NOTE — Telephone Encounter (Signed)
Corin went to Urgent Care for dizziness. He states he has vertigo. He would like a referral to ENT. Maybe related to meniere's disease. Please advise.  He still has ringing in his ears, lightheadedness and fatigue.

## 2018-03-16 NOTE — Telephone Encounter (Signed)
OK to place referral

## 2018-03-18 ENCOUNTER — Ambulatory Visit (INDEPENDENT_AMBULATORY_CARE_PROVIDER_SITE_OTHER): Payer: BLUE CROSS/BLUE SHIELD | Admitting: Family Medicine

## 2018-03-18 VITALS — BP 119/65 | HR 72 | Ht 76.0 in | Wt 297.0 lb

## 2018-03-18 DIAGNOSIS — M7711 Lateral epicondylitis, right elbow: Secondary | ICD-10-CM

## 2018-03-18 NOTE — Progress Notes (Signed)
Adam Hamilton is a 47 y.o. male who presents to [facility] today for right elbow pain.  Adam Hamilton has been seen previously for right lateral epicondylitis.  He had an injection about a year ago and did extremely well until about a month ago.  He notes pain in the right lateral elbow worse with activity better with rest.  He is been doing home exercise program including stretching and strengthening and not improving.  As he had benefit from injection in the past would like repeat injections today if possible.    ROS:  As above  Exam:  BP 119/65   Pulse 72   Ht 6\' 4"  (1.93 m)   Wt 297 lb (134.7 kg)   BMI 36.15 kg/m  General: Well Developed, well nourished, and in no acute distress.  Neuro/Psych: Alert and oriented x3, extra-ocular muscles intact, able to move all 4 extremities, sensation grossly intact. Skin: Warm and dry, no rashes noted.  Respiratory: Not using accessory muscles, speaking in full sentences, trachea midline.  Cardiovascular: Pulses palpable, no extremity edema. Abdomen: Does not appear distended. MSK: Elbow normal-appearing normal motion.  Tender palpation lateral epicondyle.  Pain with resisted wrist extension.  Lateral epicondyle injection. Consent obtained and timeout performed. Skin cleaned with rubbing alcohol and chlorhexidine. Cold spray applied. 1.5 mL of Marcaine and 40 mg of Kenalog were injected into the common extensor tendon insertion onto the lateral epicondyle Patient tolerated the procedure well    Assessment and Plan: 47 y.o. male with  Right elbow pain due to lateral epicondylitis.  Injection as above.  Continue home exercise program.  Recheck as needed.    Historical information moved to improve visibility of documentation.  Past Medical History:  Diagnosis Date  . Fatty liver 07/23/2017   Demonstrated on RUQ [place]  . Kidney stones   . Nocturnal hypoxia 08/29/2017  . OSA (obstructive sleep  apnea)   . Postural dizziness   . Prediabetes 07/29/2017   Past Surgical History:  Procedure Laterality Date  . KNEE SURGERY  2011  . LITHOTRIPSY     age 62  . PALATE SURGERY     for snoring  . TONSILLECTOMY AND ADENOIDECTOMY     For snoring.    Social History   Tobacco Use  . Smoking status: Former Smoker    Packs/day: 0.50    Years: 20.00    Pack years: 10.00    Last attempt to quit: 01/13/2012    Years since quitting: 6.1  . Smokeless tobacco: Never Used  Substance Use Topics  . Alcohol use: Yes    Alcohol/week: 1.0 - 5.0 standard drinks    Types: 1 - 5 drink(s) per week    Comment: Rarely   family history includes Colon polyps in his mother; Diabetes type I in his mother; Hypertension in his mother; Lymphoma (age of onset: 57) in his paternal grandfather; Prostate cancer in his paternal grandfather; Pulmonary fibrosis in his mother; Stomach cancer (age of onset: 24) in his father; Stroke (age of onset: 34) in his mother.  Medications: Current Outpatient Medications  Medication Sig Dispense Refill  . AMBULATORY NON FORMULARY MEDICATION Medication Name: please perform overnight pulse ox now that he is on CPAP. Fax to [facility]. 1 vial 0  . AMBULATORY NON FORMULARY MEDICATION Medication Name: CPAP set to 9 cm water pressure and repeat download in 30 days and fax to use at [phone].  FAx to [facility]. 1 Units 0  . Polyethyl  Glycol-Propyl Glycol (SYSTANE OP) Apply to eye.     No current facility-administered medications for this visit.    Allergies  Allergen Reactions  . Linzess [Linaclotide] Nausea Only      Discussed warning signs or symptoms. Please see discharge instructions. Patient expresses understanding.

## 2018-03-18 NOTE — Patient Instructions (Addendum)
Thank you for coming in today. Call or go to the ER if you develop a large red swollen joint with extreme pain or oozing puss.  Recheck as needed.  Continue home exercises.    Tennis Elbow Tennis elbow is puffiness (inflammation) of the outer tendons of your forearm close to your elbow. Your tendons attach your muscles to your bones. Tennis elbow can happen in any sport or job in which you use your elbow too much. It is caused by doing the same motion over and over. Tennis elbow can cause:  Pain and tenderness in your forearm and the outer part of your elbow.  A burning feeling. This runs from your elbow through your arm.  Weak grip in your hands.  Follow these instructions at home: Activity  Rest your elbow and wrist as told by your doctor. Try to avoid any activities that caused the problem until your doctor says that you can do them again.  If a physical therapist teaches you exercises, do all of them as told.  If you lift an object, lift it with your palm facing up. This is easier on your elbow. Lifestyle  If your tennis elbow is caused by sports, check your equipment and make sure that: ? You are using it correctly. ? It fits you well.  If your tennis elbow is caused by work, take breaks often, if you are able. Talk with your manager about doing your work in a way that is safe for you. ? If your tennis elbow is caused by computer use, talk with your manager about any changes that can be made to your work setup. General instructions  If told, apply ice to the painful area: ? Put ice in a plastic bag. ? Place a towel between your skin and the bag. ? Leave the ice on for 20 minutes, 2-3 times per day.  Take medicines only as told by your doctor.  If you were given a brace, wear it as told by your doctor.  Keep all follow-up visits as told by your doctor. This is important. Contact a doctor if:  Your pain does not get better with treatment.  Your pain gets  worse.  You have weakness in your forearm, hand, or fingers.  You cannot feel your forearm, hand, or fingers. This information is not intended to replace advice given to you by your health care provider. Make sure you discuss any questions you have with your health care provider. Document Released: 11/14/2009 Document Revised: 01/25/2016 Document Reviewed: 05/23/2014 Elsevier Interactive Patient Education  [employer].

## 2018-04-05 DIAGNOSIS — G4733 Obstructive sleep apnea (adult) (pediatric): Secondary | ICD-10-CM | POA: Diagnosis not present

## 2018-04-09 DIAGNOSIS — R42 Dizziness and giddiness: Secondary | ICD-10-CM | POA: Diagnosis not present

## 2018-04-09 DIAGNOSIS — H903 Sensorineural hearing loss, bilateral: Secondary | ICD-10-CM | POA: Diagnosis not present

## 2018-04-09 DIAGNOSIS — H9313 Tinnitus, bilateral: Secondary | ICD-10-CM | POA: Diagnosis not present

## 2018-04-13 ENCOUNTER — Ambulatory Visit: Payer: BLUE CROSS/BLUE SHIELD | Admitting: Family Medicine

## 2018-04-20 ENCOUNTER — Ambulatory Visit: Payer: BLUE CROSS/BLUE SHIELD | Admitting: Family Medicine

## 2018-04-27 ENCOUNTER — Encounter: Payer: Self-pay | Admitting: Family Medicine

## 2018-04-27 ENCOUNTER — Ambulatory Visit (INDEPENDENT_AMBULATORY_CARE_PROVIDER_SITE_OTHER): Payer: BLUE CROSS/BLUE SHIELD | Admitting: Family Medicine

## 2018-04-27 VITALS — BP 132/77 | HR 74 | Ht 74.8 in | Wt 298.0 lb

## 2018-04-27 DIAGNOSIS — R7303 Prediabetes: Secondary | ICD-10-CM

## 2018-04-27 DIAGNOSIS — G4733 Obstructive sleep apnea (adult) (pediatric): Secondary | ICD-10-CM | POA: Diagnosis not present

## 2018-04-27 LAB — BASIC METABOLIC PANEL WITH GFR
BUN: 18 mg/dL (ref 7–25)
CALCIUM: 9.8 mg/dL (ref 8.6–10.3)
CHLORIDE: 104 mmol/L (ref 98–110)
CO2: 27 mmol/L (ref 20–32)
Creat: 0.87 mg/dL (ref 0.60–1.35)
GFR, Est African American: 120 mL/min/{1.73_m2} (ref >=60)
GFR, Est Non African American: 103 mL/min/{1.73_m2} (ref >=60)
GLUCOSE: 107 mg/dL — AB (ref 65–99)
Potassium: 4.5 mmol/L (ref 3.5–5.3)
Sodium: 138 mmol/L (ref 135–146)

## 2018-04-27 LAB — POCT GLYCOSYLATED HEMOGLOBIN (HGB A1C): Hemoglobin A1C: 5.8 % — AB (ref 4.0–5.6)

## 2018-04-27 NOTE — Progress Notes (Signed)
Subjective:    CC: sleep apnea and glucose.    HPI:  F/U OSA - pt reports that he hasn't used CPAP x 2 wks he has been on vacation. he stated that he has noticed that his sleep is better but understands that he should be using CPAP. He says that with his weight loss he hasn't been snoring when he doesn't wear his CPAP.    Impaired fasting glucose-no increased thirst or urination. No symptoms consistent with hypoglycemia.   Past medical history, Surgical history, Family history not pertinant except as noted below, Social history, Allergies, and medications have been entered into the medical record, reviewed, and corrections made.   Review of Systems: No fevers, chills, night sweats, weight loss, chest pain, or shortness of breath.   Objective:    General: Well Developed, well nourished, and in no acute distress.  Neuro: Alert and oriented x3, extra-ocular muscles intact, sensation grossly intact.  HEENT: Normocephalic, atraumatic  Skin: Warm and dry, no rashes. Cardiac: Regular rate and rhythm, no murmurs rubs or gallops, no lower extremity edema.  Respiratory: Clear to auscultation bilaterally. Not using accessory muscles, speaking in full sentences.   Impression and Recommendations:    OSA - says hasn't used it in about 2 weeks. Snoring is resolved now that he has lost weight. We could consider repeat test to see if he still has apena. He will think about it.    IFG - Well controlled. Continue current regimen. Follow up in  6 months.

## 2018-04-28 NOTE — Progress Notes (Signed)
All labs are normal. 

## 2018-05-06 DIAGNOSIS — G4733 Obstructive sleep apnea (adult) (pediatric): Secondary | ICD-10-CM | POA: Diagnosis not present

## 2018-06-05 DIAGNOSIS — G4733 Obstructive sleep apnea (adult) (pediatric): Secondary | ICD-10-CM | POA: Diagnosis not present

## 2018-06-07 DIAGNOSIS — R079 Chest pain, unspecified: Secondary | ICD-10-CM | POA: Diagnosis not present

## 2018-06-07 DIAGNOSIS — Z87891 Personal history of nicotine dependence: Secondary | ICD-10-CM | POA: Diagnosis not present

## 2018-06-07 DIAGNOSIS — R0602 Shortness of breath: Secondary | ICD-10-CM | POA: Diagnosis not present

## 2018-06-07 DIAGNOSIS — R0789 Other chest pain: Secondary | ICD-10-CM | POA: Diagnosis not present

## 2018-06-07 DIAGNOSIS — Z888 Allergy status to other drugs, medicaments and biological substances status: Secondary | ICD-10-CM | POA: Diagnosis not present

## 2018-06-08 DIAGNOSIS — R079 Chest pain, unspecified: Secondary | ICD-10-CM | POA: Diagnosis not present

## 2018-06-17 ENCOUNTER — Encounter: Payer: Self-pay | Admitting: Family Medicine

## 2018-06-17 ENCOUNTER — Ambulatory Visit (INDEPENDENT_AMBULATORY_CARE_PROVIDER_SITE_OTHER): Payer: BLUE CROSS/BLUE SHIELD | Admitting: Family Medicine

## 2018-06-17 VITALS — BP 126/68 | HR 79 | Ht 75.0 in | Wt 305.0 lb

## 2018-06-17 DIAGNOSIS — R0789 Other chest pain: Secondary | ICD-10-CM | POA: Diagnosis not present

## 2018-06-17 DIAGNOSIS — M79622 Pain in left upper arm: Secondary | ICD-10-CM

## 2018-06-17 DIAGNOSIS — M542 Cervicalgia: Secondary | ICD-10-CM | POA: Diagnosis not present

## 2018-06-17 DIAGNOSIS — D72829 Elevated white blood cell count, unspecified: Secondary | ICD-10-CM

## 2018-06-17 LAB — CBC WITH DIFFERENTIAL/PLATELET
Absolute Monocytes: 1313 cells/uL — ABNORMAL HIGH (ref 200–950)
BASOS ABS: 100 {cells}/uL (ref 0–200)
Basophils Relative: 0.8 %
EOS PCT: 1.2 %
Eosinophils Absolute: 150 cells/uL (ref 15–500)
HEMATOCRIT: 45 % (ref 38.5–50.0)
Hemoglobin: 15.2 g/dL (ref 13.2–17.1)
LYMPHS ABS: 3113 {cells}/uL (ref 850–3900)
MCH: 29.7 pg (ref 27.0–33.0)
MCHC: 33.8 g/dL (ref 32.0–36.0)
MCV: 87.9 fL (ref 80.0–100.0)
MPV: 10.7 fL (ref 7.5–12.5)
Monocytes Relative: 10.5 %
NEUTROS PCT: 62.6 %
Neutro Abs: 7825 cells/uL — ABNORMAL HIGH (ref 1500–7800)
PLATELETS: 263 10*3/uL (ref 140–400)
RBC: 5.12 10*6/uL (ref 4.20–5.80)
RDW: 12.9 % (ref 11.0–15.0)
TOTAL LYMPHOCYTE: 24.9 %
WBC: 12.5 10*3/uL — AB (ref 3.8–10.8)

## 2018-06-17 MED ORDER — PREDNISONE 20 MG PO TABS: 40.0000 mg | ORAL_TABLET | Freq: Every day | ORAL | 0 refills | Status: DC

## 2018-06-17 NOTE — Progress Notes (Signed)
Established Patient Office Visit  Subjective:  Patient ID: Adam Hamilton, male    DOB: [birth year]  Age: 48 y.o. MRN: [id number]  CC:  Chief Complaint  Patient presents with  . Hospitalization Follow-up    HPI [name] presents for low up from emergency department visit at [facility] on December 30 for chest pain.  He had actually been having some chest pain on and off for 2 to 3 months but he had an episode where he actually felt short of breath and trembling along with the chest pain and so went to the emergency department.  He had a normal EKG and negative cardiac enzymes, negative d-dimer.  He was discharged home.  He is having mostly pain in the left axilla. Says feels better when he lays on his stomach with his arm up.  More painful if lays with opposite arm up. No dec ROM of the shoulder.  He denies any mass or lumps in the armpit.  Pain is dull aching and 3/10.  He reports that today he is also had some soreness in the anterior neck on the right and the left side.  Going from the top down.  He has not noticed any palpable lumps.  No other upper respiratory symptoms.  No fevers chills or sweats.  He also had an episode several months ago where he had some numbness going down his left shoulder down to his lower mid arm.  Resolved and not has not happened again  Past Medical History:  Diagnosis Date  . Fatty liver 07/23/2017   Demonstrated on RUQ [place]  . Kidney stones   . Nocturnal hypoxia 08/29/2017  . OSA (obstructive sleep apnea)   . Postural dizziness   . Prediabetes 07/29/2017    Past Surgical History:  Procedure Laterality Date  . KNEE SURGERY  2011  . LITHOTRIPSY     age 87  . PALATE SURGERY     for snoring  . TONSILLECTOMY AND ADENOIDECTOMY     For snoring.     Family History  Problem Relation Age of Onset  . Stroke Mother 54  . Hypertension Mother   . Colon polyps Mother   . Pulmonary fibrosis Mother   . Diabetes type I Mother   . Stomach cancer  Father 47  . Lymphoma Paternal Grandfather 31  . Prostate cancer Paternal Grandfather   . Heart attack Neg Hx   . Hyperlipidemia Neg Hx   . Sudden death Neg Hx     Social History   Socioeconomic History  . Marital status: Divorced    Spouse name: Angie  . Number of children: 3  . Years of education: 2  . Highest education level: Not on file  Occupational History  . Occupation: Educational psychologist    Comment: [name]  . Financial resource strain: Not on file  . Food insecurity:    Worry: Not on file    Inability: Not on file  . Transportation needs:    Medical: Not on file    Non-medical: Not on file  Tobacco Use  . Smoking status: Former Smoker    Packs/day: 0.50    Years: 20.00    Pack years: 10.00    Last attempt to quit: 01/13/2012    Years since quitting: 6.4  . Smokeless tobacco: Never Used  Substance and Sexual Activity  . Alcohol use: Yes    Alcohol/week: 1.0 - 5.0 standard drinks  Types: 1 - 5 drink(s) per week    Comment: Rarely  . Drug use: No  . Sexual activity: Yes  Lifestyle  . Physical activity:    Days per week: Not on file    Minutes per session: Not on file  . Stress: Not on file  Relationships  . Social connections:    Talks on phone: Not on file    Gets together: Not on file    Attends religious service: Not on file    Active member of club or organization: Not on file    Attends meetings of clubs or organizations: Not on file    Relationship status: Not on file  . Intimate partner violence:    Fear of current or ex partner: Not on file    Emotionally abused: Not on file    Physically abused: Not on file    Forced sexual activity: Not on file  Other Topics Concern  . Not on file  Social History Narrative   2-3 caffein drinks per day. Some regular exercise.     Outpatient Medications Prior to Visit  Medication Sig Dispense Refill  . AMBULATORY NON FORMULARY MEDICATION Medication Name:  please perform overnight pulse ox now that he is on CPAP. Fax to [facility]. 1 vial 0  . Polyethyl Glycol-Propyl Glycol (SYSTANE OP) Apply to eye.     No facility-administered medications prior to visit.     Allergies  Allergen Reactions  . Linzess [Linaclotide] Nausea Only    ROS Review of Systems    Objective:    Physical Exam  Constitutional: He is oriented to person, place, and time. He appears well-developed and well-nourished.  HENT:  Head: Normocephalic and atraumatic.  Right Ear: External ear normal.  Left Ear: External ear normal.  Nose: Nose normal.  Mouth/Throat: Oropharynx is clear and moist.  TMs and canals are clear.   Eyes: Pupils are equal, round, and reactive to light. Conjunctivae and EOM are normal.  Neck: Neck supple. No thyromegaly present.  Cardiovascular: Normal rate, regular rhythm and normal heart sounds.  No carotid bruits.   Pulmonary/Chest: Effort normal and breath sounds normal.  Musculoskeletal:        General: No edema.  Lymphadenopathy:    He has no cervical adenopathy.  Neurological: He is alert and oriented to person, place, and time.  Skin: Skin is warm and dry.  Psychiatric: He has a normal mood and affect. His behavior is normal.    BP 126/68   Pulse 79   Ht 6\' 3"  (1.905 m)   Wt (!) 305 lb (138.3 kg)   SpO2 97%   BMI 38.12 kg/m  Wt Readings from Last 3 Encounters:  06/17/18 (!) 305 lb (138.3 kg)  04/27/18 298 lb (135.2 kg)  03/18/18 297 lb (134.7 kg)     Health Maintenance Due  Topic Date Due  . HIV Screening  07/09/1986    There are no preventive care reminders to display for this patient.  Lab Results  Component Value Date   TSH 2.020 11/29/2010   Lab Results  Component Value Date   WBC 10.6 07/24/2017   HGB 15.4 07/24/2017   HCT 44.9 07/24/2017   MCV 85.7 07/24/2017   PLT 282 07/24/2017   Lab Results  Component Value Date   NA 138 04/27/2018   K 4.5 04/27/2018   CO2 27 04/27/2018   GLUCOSE 107 (H)  04/27/2018   BUN 18 04/27/2018   CREATININE 0.87 04/27/2018   BILITOT  0.6 07/24/2017   ALKPHOS 74 08/01/2014   AST 21 07/24/2017   ALT 32 07/24/2017   PROT 7.5 07/24/2017   ALBUMIN 4.6 08/01/2014   CALCIUM 9.8 04/27/2018   ANIONGAP 14 01/27/2014   Lab Results  Component Value Date   CHOL 150 07/24/2017   Lab Results  Component Value Date   HDL 32 (L) 07/24/2017   Lab Results  Component Value Date   LDLCALC 95 07/24/2017   Lab Results  Component Value Date   TRIG 131 07/24/2017   Lab Results  Component Value Date   CHOLHDL 4.7 07/24/2017   Lab Results  Component Value Date   HGBA1C 5.8 (A) 04/27/2018      Assessment & Plan:   Problem List Items Addressed This Visit    None    Visit Diagnoses    Leukocytosis, unspecified type    -  Primary   Relevant Orders   CBC with Differential/Platelet   Exercise Tolerance Test   Left axillary pain       Relevant Orders   Exercise Tolerance Test   Atypical chest pain       Relevant Orders   Exercise Tolerance Test   Neck pain         Atypical chest pain-his pain is mostly on the left lateral chest radiating from the axilla.  I really think this is more musculoskeletal nature based on his description and the fact that it can be better or worse depending on position of that shoulder while sleeping at night.  This makes it more reassuring that it is less likely to be cardiac.  Plus he had initial negative work-up.  His last stress test was about 4-1/2 years ago so we will go ahead and order a treadmill stress test.  I think the likelihood is low but he is quite concerned about his heart.  Left axillary pain-explained that this could be coming from his shoulder or even from an upper thoracic nerve compression causing pain in the axilla that is wrapping around into his chest.  Consider x-ray of the thoracic spine if not improving.  Recommend a trial of prednisone in case it is a pinched nerve to see if this helps relieve some  of his discomfort.  He had to use Tylenol with the prednisone but avoid NSAIDs during that time.  Soreness in his anterior neck -unclear etiology.  I do not feel any significant lymphadenopathy on exam.  He is not having pain in his throat with swallowing.  He did have a mildly elevated white blood cell count a week ago in emergency department so we will recheck that today.  Leukocytosis-recheck white blood cell count.  Meds ordered this encounter  Medications  . predniSONE (DELTASONE) 20 MG tablet    Sig: Take 2 tablets (40 mg total) by mouth daily with breakfast.    Dispense:  10 tablet    Refill:  0    Follow-up: Return if symptoms worsen or fail to improve.    Beatrice Lecher, MD

## 2018-07-01 ENCOUNTER — Emergency Department (INDEPENDENT_AMBULATORY_CARE_PROVIDER_SITE_OTHER): Payer: BLUE CROSS/BLUE SHIELD

## 2018-07-01 ENCOUNTER — Emergency Department (INDEPENDENT_AMBULATORY_CARE_PROVIDER_SITE_OTHER)
Admission: EM | Admit: 2018-07-01 | Discharge: 2018-07-01 | Disposition: A | Discharge status: Home / Self Care | Payer: BLUE CROSS/BLUE SHIELD | Source: Home / Self Care | Attending: Emergency Medicine | Admitting: Emergency Medicine

## 2018-07-01 ENCOUNTER — Encounter: Payer: Self-pay | Admitting: *Deleted

## 2018-07-01 DIAGNOSIS — R05 Cough: Secondary | ICD-10-CM | POA: Diagnosis not present

## 2018-07-01 DIAGNOSIS — R69 Illness, unspecified: Secondary | ICD-10-CM | POA: Diagnosis not present

## 2018-07-01 DIAGNOSIS — J111 Influenza due to unidentified influenza virus with other respiratory manifestations: Secondary | ICD-10-CM

## 2018-07-01 DIAGNOSIS — J101 Influenza due to other identified influenza virus with other respiratory manifestations: Secondary | ICD-10-CM | POA: Diagnosis not present

## 2018-07-01 DIAGNOSIS — R0609 Other forms of dyspnea: Secondary | ICD-10-CM

## 2018-07-01 DIAGNOSIS — R0600 Dyspnea, unspecified: Secondary | ICD-10-CM

## 2018-07-01 DIAGNOSIS — R059 Cough, unspecified: Secondary | ICD-10-CM

## 2018-07-01 LAB — POCT RAPID STREP A (OFFICE): Rapid Strep A Screen: NEGATIVE

## 2018-07-01 LAB — POCT INFLUENZA A/B
Influenza A, POC: NEGATIVE
Influenza B, POC: POSITIVE — AB

## 2018-07-01 MED ORDER — OSELTAMIVIR PHOSPHATE 75 MG PO CAPS: ORAL_CAPSULE | ORAL | 0 refills | Status: DC

## 2018-07-01 NOTE — Discharge Instructions (Addendum)
You have influenza B.  Positive rapid flu test for influenza B.-Negative for influenza A. Rapid strep test negative. Chest x-ray within normal limits. The treatment is rest, pushing water and other fluids.. Tamiflu twice a day for 5 days.  Prescription sent to your pharmacy.  This should shorten the course of influenza. Please read attached instruction sheet on influenza. Follow-up with your PCP if not improving in 5 to 7 days or sooner if worse or new symptoms.

## 2018-07-01 NOTE — ED Triage Notes (Signed)
Patient c/o 2 days of HA, sneezing, runny nose. Cough with chest burning and fatigue x yesterday. Afebrile. Taken tylenol cold otc.

## 2018-07-01 NOTE — ED Provider Notes (Signed)
[facility]    CSN: [id number] Arrival date & time: 07/01/18  4235     History   Chief Complaint No chief complaint on file.  Chief complaint: Flu symptoms, cough, dyspnea x2 days HPI Adam Hamilton is a 48 y.o. male.   HPI  HPI : Onset 2 days ago..  Felt feverish, but no documented fever.  With occasional chills, sweats, mild myalgias, fatigue, headache.  Tylenol helps fever.  Occasionally feels flushed.  However, symptoms are progressively worsening, with cough is nonproductive, sore throat, and discolored nasal discharge.  Has a vague feeling of bilateral "chest burning" with dyspnea on exertion.  Denies actual chest pain.  He has decreased appetite, but tolerating some liquids by mouth. No history of recent tick bite. He was traveling the past 2 days and went through an airport on the [place] that had reports of recent virus from [place].  Review of Systems: Positive for fatigue, mild nasal congestion,  sore throat, mild swollen anterior neck glands, Negative for acute vision changes, stiff neck, focal weakness, syncope, seizures, respiratory distress, vomiting, diarrhea, GU symptoms, new rash.  Past Medical History:  Diagnosis Date  . Fatty liver 07/23/2017   Demonstrated on RUQ [place]  . Kidney stones   . Nocturnal hypoxia 08/29/2017  . OSA (obstructive sleep apnea)   . Postural dizziness   . Prediabetes 07/29/2017    Patient Active Problem List   Diagnosis Date Noted  . S/P ASA (advanced surface ablation) surgery PRK (photorefractive keratectomy) 09/21/2017  . 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 Nocturnal hypoxia 08/29/2017  . Prediabetes 07/29/2017  . Hyperglycemia 07/25/2017  . Tinea corporis 07/23/2017  . Postural dizziness with presyncope 07/23/2017  . Heart palpitations 07/23/2017  . Orthostasis 07/23/2017  . Fatty liver 07/23/2017  . History of cardiovascular stress test 07/23/2017  . Obstructive sleep apnea of adult 07/23/2017  .  History of nephrolithiasis 07/23/2017  . Myopia with astigmatism and presbyopia, bilateral 03/10/2017  . CN (constipation) 08/01/2014  . Umbilical hernia without obstruction and without gangrene 08/01/2014  . Obesity 03/21/2014  . Change in bowel function 07/08/2013  . Rectal bleeding 07/08/2013  . Dizzy spells 12/26/2010    Past Surgical History:  Procedure Laterality Date  . KNEE SURGERY  2011  . LITHOTRIPSY     age 44  . PALATE SURGERY     for snoring  . TONSILLECTOMY AND ADENOIDECTOMY     For snoring.        Home Medications    Prior to Admission medications   Medication Sig Start Date End Date Taking? Authorizing Provider  b complex vitamins tablet Take 1 tablet by mouth daily.   Yes [provider]  magnesium citrate SOLN Take 1 Bottle by mouth once.   Yes [provider]  Multiple Vitamin (MULTIVITAMIN) capsule Take 1 capsule by mouth daily.   Yes [provider]  oseltamivir (TAMIFLU) 75 MG capsule Starting today, take 1 capsule by mouth twice a day for 5 days. 07/01/18   Jacqulyn Cane, MD  Polyethyl Glycol-Propyl Glycol (SYSTANE OP) Apply to eye.    [provider]    Family History Family History  Problem Relation Age of Onset  . Stroke Mother 34  . Hypertension Mother   . Colon polyps Mother   . Pulmonary fibrosis Mother   . Diabetes type I Mother   . Stomach cancer Father 23  . Lymphoma Paternal Grandfather 5  . Prostate cancer Paternal Grandfather   .  Heart attack Neg Hx   . Hyperlipidemia Neg Hx   . Sudden death Neg Hx     Social History Social History   Tobacco Use  . Smoking status: Former Smoker    Packs/day: 0.50    Years: 20.00    Pack years: 10.00    Last attempt to quit: 01/13/2012    Years since quitting: 6.4  . Smokeless tobacco: Never Used  Substance Use Topics  . Alcohol use: Yes    Alcohol/week: 1.0 - 5.0 standard drinks    Types: 1 - 5 drink(s) per week    Comment: Rarely  . Drug use: No      Allergies   Linzess [linaclotide]   Review of Systems Review of Systems  Pertinent items noted in HPI and remainder of comprehensive ROS otherwise negative.  Physical Exam Triage Vital Signs ED Triage Vitals  Enc Vitals Group     BP      Pulse      Resp      Temp      Temp src      SpO2      Weight      Height      Head Circumference      Peak Flow      Pain Score      Pain Loc      Pain Edu?      Excl. in [place]?    No data found.  Updated Vital Signs BP 134/76 (BP Location: Right Arm)   Pulse 80   Temp 98 F (36.7 C) (Oral)   Resp 16   Wt (!) 139.3 kg   SpO2 97%   BMI 38.37 kg/m   Visual Acuity Right Eye Distance:   Left Eye Distance:   Bilateral Distance:    Right Eye Near:   Left Eye Near:    Bilateral Near:     Physical Exam Vitals signs and nursing note reviewed.  Constitutional:      General: He is not in acute distress.    Appearance: He is well-developed. He is ill-appearing (very fatigued, but no cardiorespiratory distress) and diaphoretic. He is not toxic-appearing.  HENT:     Head: Normocephalic and atraumatic.     Right Ear: Tympanic membrane and external ear normal.     Left Ear: Tympanic membrane and external ear normal.     Nose: Rhinorrhea present.     Mouth/Throat:     Pharynx: Posterior oropharyngeal erythema (mild redness ) present. No oropharyngeal exudate.     Comments: Surgically absent uvula and tonsils. Posterior pharynx is red and inflamed.  Airway intact.  No exudate Eyes:     General: No scleral icterus.       Right eye: No discharge.        Left eye: No discharge.     Conjunctiva/sclera: Conjunctivae normal.  Neck:     Musculoskeletal: Normal range of motion and neck supple. No neck rigidity.  Cardiovascular:     Rate and Rhythm: Normal rate and regular rhythm.     Heart sounds: Normal heart sounds. No murmur. No friction rub.  Pulmonary:     Effort: No respiratory distress.     Breath sounds: No stridor.  Rhonchi (Diffuse anterior rhonchi) present. No wheezing or rales.  Abdominal:     Palpations: Abdomen is soft.     Tenderness: There is no abdominal tenderness.  Lymphadenopathy:     Cervical: Cervical adenopathy (shoddy, tender bilateral anterior cervical nodes)  present.  Skin:    General: Skin is warm.     Findings: No rash.  Neurological:     Mental Status: He is alert.      UC Treatments / Results  Labs (all labs ordered are listed, but only abnormal results are displayed) Labs Reviewed  POCT INFLUENZA A/B - Abnormal; Notable for the following components:      Result Value   Influenza B, POC Positive (*)    All other components within normal limits  POCT RAPID STREP A (OFFICE)    EKG None  Radiology Dg Chest 2 View  Result Date: 07/01/2018 CLINICAL DATA:  Fever with cough and congestion EXAM: CHEST - 2 VIEW COMPARISON:  January 27, 2014 FINDINGS: Lungs are clear. The heart size and pulmonary vascularity are normal. No adenopathy. No bone lesions. IMPRESSION: No edema or consolidation. Electronically Signed   By: Lowella Grip III M.D.   On: 07/01/2018 11:03    Procedures Procedures (including critical care time)  Medications Ordered in UC Medications - No data to display  Initial Impression / Assessment and Plan / UC Course  I have reviewed the triage vital signs and the nursing notes.  Pertinent labs & imaging results that were available during my care of the patient were reviewed by me and considered in my medical decision making (see chart for details).  Clinical Course as of Jul 01 1122  Wed Jul 01, 2018  1109 DG Chest 2 View [DM]    Clinical Course User Index [DM] Jacqulyn Cane, MD   10:34 AM 2-day history of influenza-like symptoms but also has red pharynx and anterior cervical lymph nodes and rhonchi on auscultation.  With his travel exposure and history of dyspnea on exertion, will order chest x-ray.  Pulse ox today normal 97%. I also ordered  strep and rapid flu tests.  11:09 AM Chest x-ray within normal limits. Rapid strep test negative. Flu tests: Positive influenza B  Discussed with patient  Final Clinical Impressions(s) / UC Diagnoses   Final diagnoses:  Cough  Dyspnea on exertion  Influenza-like illness  Influenza B  Treatment options discussed, as well as risks, benefits, alternatives. Patient voiced understanding and agreement with the following plans:    Discharge Instructions     You have influenza B.  Positive rapid flu test for influenza B.-Negative for influenza A. Rapid strep test negative. Chest x-ray within normal limits. The treatment is rest, pushing water and other fluids.. Tamiflu twice a day for 5 days.  Prescription sent to your pharmacy.  This should shorten the course of influenza. Please read attached instruction sheet on influenza. Follow-up with your PCP if not improving in 5 to 7 days or sooner if worse or new symptoms.    ED Prescriptions    Medication Sig Dispense Auth. Provider   oseltamivir (TAMIFLU) 75 MG capsule Starting today, take 1 capsule by mouth twice a day for 5 days. 10 capsule Jacqulyn Cane, MD     Precautions discussed. Red flags discussed. Questions invited and answered. Patient voiced understanding and agreement.    Jacqulyn Cane, MD 07/01/18 1124

## 2018-07-06 DIAGNOSIS — G4733 Obstructive sleep apnea (adult) (pediatric): Secondary | ICD-10-CM | POA: Diagnosis not present

## 2018-07-13 DIAGNOSIS — R0789 Other chest pain: Secondary | ICD-10-CM | POA: Diagnosis not present

## 2018-07-14 ENCOUNTER — Telehealth: Payer: Self-pay | Admitting: Family Medicine

## 2018-07-14 NOTE — Telephone Encounter (Signed)
Call pt: stress test was normal. This is very reassuring that he heart is doing well.  Exercise tolerance was fair so need to work on regular exercise to improve indurance. Please tell him I hop that he is feeling better from the the flu.

## 2018-07-15 NOTE — Telephone Encounter (Signed)
Pt advised. He is feeling better from the flu, but still having the axilla pain. No palpable mass. Questions next steps. Routing.

## 2018-07-15 NOTE — Telephone Encounter (Signed)
OK, based on how he describes his symptoms that seem to change with position such as putting his arm when sleeping etc. I think this could actually be more musculoskeletal and coming either from the chest wall or his shoulder.  I be happy to get him in with 1 of our sports med docs here for further evaluation especially if his pain has persisted.

## 2018-07-16 NOTE — Telephone Encounter (Signed)
Patient advised of recommendations. He will call back if symptoms persist.

## 2018-07-21 ENCOUNTER — Other Ambulatory Visit: Payer: Self-pay

## 2018-07-21 ENCOUNTER — Encounter: Payer: Self-pay | Admitting: *Deleted

## 2018-07-21 ENCOUNTER — Emergency Department (INDEPENDENT_AMBULATORY_CARE_PROVIDER_SITE_OTHER)
Admission: EM | Admit: 2018-07-21 | Discharge: 2018-07-21 | Disposition: A | Discharge status: Home / Self Care | Payer: BLUE CROSS/BLUE SHIELD | Source: Home / Self Care

## 2018-07-21 DIAGNOSIS — J111 Influenza due to unidentified influenza virus with other respiratory manifestations: Secondary | ICD-10-CM

## 2018-07-21 MED ORDER — OSELTAMIVIR PHOSPHATE 75 MG PO CAPS: 75.0000 mg | ORAL_CAPSULE | Freq: Two times a day (BID) | ORAL | 0 refills | Status: DC

## 2018-07-21 NOTE — ED Triage Notes (Signed)
Pt c/o body aches, chills, mild nausea, temp 101.5, and mild cough x last night.

## 2018-07-21 NOTE — ED Provider Notes (Signed)
[facility]    CSN: [id number] Arrival date & time: 07/21/18  1052     History   Chief Complaint Chief Complaint  Patient presents with  . Chills  . Nausea  . Generalized Body Aches    HPI Adam Hamilton is a 48 y.o. male.   HPI Patient had influenza B a few weeks ago.  He was over that.  He had a little cough for the last few days, then yesterday evening developed a acute chills.  He was shivering all night.  He took his temp during the night and it was over 101.  He has continued to be febrile today.  He can do his job from home, so plans to not have to take off from work other than just working from home.  He lives with his fiance who has given things back and forth to him.  No other major chronic illnesses.  He does have OSA and has had T&A and uvulectomy done. Past Medical History:  Diagnosis Date  . Fatty liver 07/23/2017   Demonstrated on RUQ [place]  . Kidney stones   . Nocturnal hypoxia 08/29/2017  . OSA (obstructive sleep apnea)   . Postural dizziness   . Prediabetes 07/29/2017    Patient Active Problem List   Diagnosis Date Noted  . S/P ASA (advanced surface ablation) surgery PRK (photorefractive keratectomy) 09/21/2017  . 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 Nocturnal hypoxia 08/29/2017  . Prediabetes 07/29/2017  . Hyperglycemia 07/25/2017  . Tinea corporis 07/23/2017  . Postural dizziness with presyncope 07/23/2017  . Heart palpitations 07/23/2017  . Orthostasis 07/23/2017  . Fatty liver 07/23/2017  . History of cardiovascular stress test 07/23/2017  . Obstructive sleep apnea of adult 07/23/2017  . History of nephrolithiasis 07/23/2017  . Myopia with astigmatism and presbyopia, bilateral 03/10/2017  . CN (constipation) 08/01/2014  . Umbilical hernia without obstruction and without gangrene 08/01/2014  . Obesity 03/21/2014  . Change in bowel function 07/08/2013  . Rectal bleeding 07/08/2013  . Dizzy spells 12/26/2010    Past  Surgical History:  Procedure Laterality Date  . KNEE SURGERY  2011  . LITHOTRIPSY     age 37  . PALATE SURGERY     for snoring  . TONSILLECTOMY AND ADENOIDECTOMY     For snoring.        Home Medications    Prior to Admission medications   Medication Sig Start Date End Date Taking? Authorizing Provider  b complex vitamins tablet Take 1 tablet by mouth daily.    [provider]  magnesium citrate SOLN Take 1 Bottle by mouth once.    [provider]  Multiple Vitamin (MULTIVITAMIN) capsule Take 1 capsule by mouth daily.    [provider]  oseltamivir (TAMIFLU) 75 MG capsule Take 1 capsule (75 mg total) by mouth 2 (two) times daily. 07/21/18   Posey Boyer, MD    Family History Family History  Problem Relation Age of Onset  . Stroke Mother 60  . Hypertension Mother   . Colon polyps Mother   . Pulmonary fibrosis Mother   . Diabetes type I Mother   . Stomach cancer Father 87  . Lymphoma Paternal Grandfather 69  . Prostate cancer Paternal Grandfather   . Heart attack Neg Hx   . Hyperlipidemia Neg Hx   . Sudden death Neg Hx     Social History Social History   Tobacco Use  . Smoking status: Former Smoker  Packs/day: 0.50    Years: 20.00    Pack years: 10.00    Last attempt to quit: 01/13/2012    Years since quitting: 6.5  . Smokeless tobacco: Never Used  Substance Use Topics  . Alcohol use: Yes    Alcohol/week: 1.0 - 5.0 standard drinks    Types: 1 - 5 drink(s) per week    Comment: Rarely  . Drug use: No     Allergies   Linzess [linaclotide]   Review of Systems Review of Systems Constitutional: Some achiness HEENT: Was sneezing before, not today.  His left ear has chronic discomfort from old Bell's palsy.  He has mild head congestion and sore throat. Cardiovascular: Unremarkable Respiratory: Mild cough GI: Unremarkable GU: Unremarkable   Physical Exam Triage Vital Signs ED Triage Vitals  Enc Vitals Group     BP  07/21/18 1107 115/77     Pulse Rate 07/21/18 1107 88     Resp 07/21/18 1107 18     Temp 07/21/18 1107 98.2 F (36.8 C)     Temp Source 07/21/18 1107 Oral     SpO2 07/21/18 1107 98 %     Weight 07/21/18 1108 (!) 307 lb (139.3 kg)     Height 07/21/18 1108 6\' 5"  (1.956 m)     Head Circumference --      Peak Flow --      Pain Score 07/21/18 1107 0     Pain Loc --      Pain Edu? --      Excl. in [place]? --    No data found.  Updated Vital Signs BP 115/77 (BP Location: Right Arm)   Pulse 88   Temp 98.2 F (36.8 C) (Oral)   Resp 18   Ht 6\' 5"  (1.956 m)   Wt (!) 139.3 kg   SpO2 98%   BMI 36.40 kg/m   Visual Acuity Right Eye Distance:   Left Eye Distance:   Bilateral Distance:    Right Eye Near:   Left Eye Near:    Bilateral Near:     Physical Exam Large man, no major distress.  TMs normal.  Sinuses nontender.  Throat mildly erythematous.  Has scarring from his old surgeries.  Neck supple without significant nodes.  Chest clear to auscultation.  Heart regular without murmur.  UC Treatments / Results  Labs (all labs ordered are listed, but only abnormal results are displayed) Labs Reviewed - No data to display  EKG None  Radiology No results found.  Procedures Procedures (including critical care time) None Medications Ordered in UC Medications - No data to display  Initial Impression / Assessment and Plan / UC Course  I have reviewed the triage vital signs and the nursing notes.  Pertinent labs & imaging results that were available during my care of the patient were reviewed by me and considered in my medical decision making (see chart for details).     Consistent with influenza again.  Decided not to test him. Final Clinical Impressions(s) / UC Diagnoses   Final diagnoses:  Influenza     Discharge Instructions     Drink plenty of fluids and get enough rest  Take Tamiflu 75 mg 1 twice daily for 5 days for presumed influenza A  Is over-the-counter  cough medication such as Robitussin-DM or Mucinex DM if needed for cough  Take Tylenol 500 mg 2 pills 3 times daily and/or ibuprofen 200 mg 3 pills 3 times daily as needed for fever and  achiness  Return if worse, especially if worsening respiratory status.  Work from home for the next few days and share this illness with your friends.    ED Prescriptions    Medication Sig Dispense Auth. Provider   oseltamivir (TAMIFLU) 75 MG capsule Take 1 capsule (75 mg total) by mouth 2 (two) times daily. 10 capsule Posey Boyer, MD     Controlled Substance Prescriptions [place] Controlled Substance Registry consulted? No   Posey Boyer, MD 07/21/18 1152

## 2018-07-21 NOTE — Discharge Instructions (Addendum)
Drink plenty of fluids and get enough rest  Take Tamiflu 75 mg 1 twice daily for 5 days for presumed influenza A  Is over-the-counter cough medication such as Robitussin-DM or Mucinex DM if needed for cough  Take Tylenol 500 mg 2 pills 3 times daily and/or ibuprofen 200 mg 3 pills 3 times daily as needed for fever and achiness  Return if worse, especially if worsening respiratory status.  Work from home for the next few days and share this illness with your friends.

## 2018-07-22 DIAGNOSIS — H52203 Unspecified astigmatism, bilateral: Secondary | ICD-10-CM | POA: Diagnosis not present

## 2018-07-22 DIAGNOSIS — Z9889 Other specified postprocedural states: Secondary | ICD-10-CM | POA: Diagnosis not present

## 2018-08-06 DIAGNOSIS — G4733 Obstructive sleep apnea (adult) (pediatric): Secondary | ICD-10-CM | POA: Diagnosis not present

## 2018-09-04 DIAGNOSIS — G4733 Obstructive sleep apnea (adult) (pediatric): Secondary | ICD-10-CM | POA: Diagnosis not present

## 2018-10-01 ENCOUNTER — Telehealth: Payer: BLUE CROSS/BLUE SHIELD | Admitting: Physician Assistant

## 2018-10-01 DIAGNOSIS — R0602 Shortness of breath: Secondary | ICD-10-CM

## 2018-10-01 NOTE — Progress Notes (Signed)
E-Visit for Corona Virus Screening  Based on your current symptoms, it seems unlikely that your symptoms are related to the Coronavirus.  See information below regarding information on the coronavirus.  I would actually recommend you schedule a video visit with Dr. Madilyn Fireman for further assessment of this as she knows your history better. This could be asthma-related due to allergies, versus mild virus. Could also be cardiac in nature giving windedness with exertion. Please follow-up with your PCP.  Coronavirus disease 2019 (COVID-19) is a respiratory illness that can spread from person to person. The virus that causes COVID-19 is a new virus that was first identified in the country of [place] but is now found in multiple other countries and has spread to the [place].  Symptoms associated with the virus are mild to severe fever, cough, and shortness of breath. There is currently no vaccine to protect against COVID-19, and there is no specific antiviral treatment for the virus.   To be considered HIGH RISK for Coronavirus (COVID-19), you have to meet the following criteria:  . Traveled to [place], [place], [place], [place] or [place]; or in the [place] to [place], [place], [place], or [place]; and have fever, cough, and shortness of breath within the last 2 weeks of travel OR  . Been in close contact with a person diagnosed with COVID-19 within the last 2 weeks and have fever, cough, and shortness of breath  . IF YOU DO NOT MEET THESE CRITERIA, YOU ARE CONSIDERED LOW RISK FOR COVID-19.   It is vitally important that if you feel that you have an infection such as this virus or any other virus that you stay home and away from places where you may spread it to others.  You should self-quarantine for 14 days if you have symptoms that could potentially be coronavirus and avoid contact with people age 63 and older.   You may also take acetaminophen (Tylenol) as needed for fever.   Reduce  your risk of any infection by using the same precautions used for avoiding the common cold or flu:  Marland Kitchen Wash your hands often with soap and warm water for at least 20 seconds.  If soap and water are not readily available, use an alcohol-based hand sanitizer with at least 60% alcohol.  . If coughing or sneezing, cover your mouth and nose by coughing or sneezing into the elbow areas of your shirt or coat, into a tissue or into your sleeve (not your hands). . Avoid shaking hands with others and consider head nods or verbal greetings only. . Avoid touching your eyes, nose, or mouth with unwashed hands.  . Avoid close contact with people who are sick. . Avoid places or events with large numbers of people in one location, like concerts or sporting events. . Carefully consider travel plans you have or are making. . If you are planning any travel outside or inside the [place], visit the CDC's Travelers' Health webpage for the latest health notices. . If you have some symptoms but not all symptoms, continue to monitor at home and seek medical attention if your symptoms worsen. . If you are having a medical emergency, call 911.  HOME CARE . Only take medications as instructed by your medical team. . Drink plenty of fluids and get plenty of rest. . A steam or ultrasonic humidifier can help if you have congestion.   GET HELP RIGHT AWAY IF: . You develop worsening fever. . You become short of  breath . You cough up blood. . Your symptoms become more severe MAKE SURE YOU   Understand these instructions.  Will watch your condition.  Will get help right away if you are not doing well or get worse.  Your e-visit answers were reviewed by a board certified advanced clinical practitioner to complete your personal care plan.  Depending on the condition, your plan could have included both over the counter or prescription medications.  If there is a problem please reply once you have received a response from your  provider. Your safety is important to [place].  If you have drug allergies check your prescription carefully.    You can use MyChart to ask questions about today's visit, request a non-urgent call back, or ask for a work or school excuse for 24 hours related to this e-Visit. If it has been greater than 24 hours you will need to follow up with your provider, or enter a new e-Visit to address those concerns. You will get an e-mail in the next two days asking about your experience.  I hope that your e-visit has been valuable and will speed your recovery. Thank you for using e-visits.

## 2018-10-01 NOTE — Progress Notes (Signed)
I have spent 5 minutes in review of e-visit questionnaire, review and updating patient chart, medical decision making and response to patient.   Drewey Begue Cody Jayveion Stalling, PA-C    

## 2018-10-05 ENCOUNTER — Encounter: Payer: Self-pay | Admitting: Family Medicine

## 2018-10-05 ENCOUNTER — Telehealth: Payer: Self-pay | Admitting: Family Medicine

## 2018-10-05 ENCOUNTER — Ambulatory Visit (INDEPENDENT_AMBULATORY_CARE_PROVIDER_SITE_OTHER): Payer: BLUE CROSS/BLUE SHIELD | Admitting: Family Medicine

## 2018-10-05 VITALS — Temp 97.2°F

## 2018-10-05 DIAGNOSIS — R6889 Other general symptoms and signs: Secondary | ICD-10-CM

## 2018-10-05 DIAGNOSIS — R0602 Shortness of breath: Secondary | ICD-10-CM | POA: Diagnosis not present

## 2018-10-05 DIAGNOSIS — Z20822 Contact with and (suspected) exposure to covid-19: Secondary | ICD-10-CM

## 2018-10-05 DIAGNOSIS — R197 Diarrhea, unspecified: Secondary | ICD-10-CM | POA: Diagnosis not present

## 2018-10-05 NOTE — Telephone Encounter (Signed)
Pt advised and will come at noon.

## 2018-10-05 NOTE — Telephone Encounter (Signed)
Please call pt and see if he can drive up to our circle out front at noon and I will come out to swab him.

## 2018-10-05 NOTE — Progress Notes (Signed)
Virtual Visit via Video Note  I connected with Oris Drone on 10/05/18 at  7:10 AM EDT by a video enabled telemedicine application and verified that I am speaking with the correct person using two identifiers.   I discussed the limitations of evaluation and management by telemedicine and the availability of in person appointments. The patient expressed understanding and agreed to proceed.   Acute Office Visit  Subjective:    Patient ID: Adam Hamilton, male    DOB: 05-14-1971, 48 y.o.   MRN: [id number]  No chief complaint on file.   HPI Patient s/o that 6 days ago suddenly lost appetite and didn't eat at all and by Friday had a metallic test taste in his mouth and started feeling nauseated and had diarrhea. No vomiting. Yesterday diarrhea was better.  Now today feels some SOB across his chest but no cough.  Fiance works in health care.  Has been eating some bland food.  Stay hydrated. No wfever.  Taking tylenol and mucinex.  Using Zycam swabs.  No known positive exposure to [place] but he says his girlfriend works in Corporate treasurer.  No recent travel although he was in [place] about 5 weeks ago.  Past Medical History:  Diagnosis Date  . Fatty liver 07/23/2017   Demonstrated on RUQ [place]  . Kidney stones   . Nocturnal hypoxia 08/29/2017  . OSA (obstructive sleep apnea)   . Postural dizziness   . Prediabetes 07/29/2017    Past Surgical History:  Procedure Laterality Date  . KNEE SURGERY  2011  . LITHOTRIPSY     age 64  . PALATE SURGERY     for snoring  . TONSILLECTOMY AND ADENOIDECTOMY     For snoring.     Family History  Problem Relation Age of Onset  . Stroke Mother 2  . Hypertension Mother   . Colon polyps Mother   . Pulmonary fibrosis Mother   . Diabetes type I Mother   . Stomach cancer Father 33  . Lymphoma Paternal Grandfather 43  . Prostate cancer Paternal Grandfather   . Heart attack Neg Hx   . Hyperlipidemia Neg Hx   . Sudden death Neg Hx     Social History    Socioeconomic History  . Marital status: Divorced    Spouse name: Angie  . Number of children: 3  . Years of education: 2  . Highest education level: Not on file  Occupational History  . Occupation: Educational psychologist    Comment: [employer]  . Financial resource strain: Not on file  . Food insecurity:    Worry: Not on file    Inability: Not on file  . Transportation needs:    Medical: Not on file    Non-medical: Not on file  Tobacco Use  . Smoking status: Former Smoker    Packs/day: 0.50    Years: 20.00    Pack years: 10.00    Last attempt to quit: 01/13/2012    Years since quitting: 6.7  . Smokeless tobacco: Never Used  Substance and Sexual Activity  . Alcohol use: Yes    Alcohol/week: 1.0 - 5.0 standard drinks    Types: 1 - 5 drink(s) per week    Comment: Rarely  . Drug use: No  . Sexual activity: Yes  Lifestyle  . Physical activity:    Days per week: Not on file    Minutes per session: Not on file  . Stress: Not on  file  Relationships  . Social connections:    Talks on phone: Not on file    Gets together: Not on file    Attends religious service: Not on file    Active member of club or organization: Not on file    Attends meetings of clubs or organizations: Not on file    Relationship status: Not on file  . Intimate partner violence:    Fear of current or ex partner: Not on file    Emotionally abused: Not on file    Physically abused: Not on file    Forced sexual activity: Not on file  Other Topics Concern  . Not on file  Social History Narrative   2-3 caffein drinks per day. Some regular exercise.     Outpatient Medications Prior to Visit  Medication Sig Dispense Refill  . b complex vitamins tablet Take 1 tablet by mouth daily.    . magnesium citrate SOLN Take 1 Bottle by mouth once.    . Multiple Vitamin (MULTIVITAMIN) capsule Take 1 capsule by mouth daily.    Marland Kitchen oseltamivir (TAMIFLU) 75 MG capsule Take 1  capsule (75 mg total) by mouth 2 (two) times daily. 10 capsule 0   No facility-administered medications prior to visit.     Allergies  Allergen Reactions  . Linzess [Linaclotide] Nausea Only    ROS     Objective:    Physical Exam  Constitutional: He is oriented to person, place, and time. He appears well-developed.  HENT:  Head: Normocephalic and atraumatic.  Neurological: He is alert and oriented to person, place, and time.  Psychiatric: He has a normal mood and affect.   General: Speaking clearly in complete sentences without any shortness of breath.  Alert and oriented x3.  Normal judgment. No apparent acute distress.   Temp (!) 97.2 F (36.2 C)  Wt Readings from Last 3 Encounters:  07/21/18 (!) 307 lb (139.3 kg)  07/01/18 (!) 307 lb (139.3 kg)  06/17/18 (!) 305 lb (138.3 kg)    Health Maintenance Due  Topic Date Due  . HIV Screening  07/09/1986    There are no preventive care reminders to display for this patient.   Lab Results  Component Value Date   TSH 2.020 11/29/2010   Lab Results  Component Value Date   WBC 12.5 (H) 06/17/2018   HGB 15.2 06/17/2018   HCT 45.0 06/17/2018   MCV 87.9 06/17/2018   PLT 263 06/17/2018   Lab Results  Component Value Date   NA 138 04/27/2018   K 4.5 04/27/2018   CO2 27 04/27/2018   GLUCOSE 107 (H) 04/27/2018   BUN 18 04/27/2018   CREATININE 0.87 04/27/2018   BILITOT 0.6 07/24/2017   ALKPHOS 74 08/01/2014   AST 21 07/24/2017   ALT 32 07/24/2017   PROT 7.5 07/24/2017   ALBUMIN 4.6 08/01/2014   CALCIUM 9.8 04/27/2018   ANIONGAP 14 01/27/2014   Lab Results  Component Value Date   CHOL 150 07/24/2017   Lab Results  Component Value Date   HDL 32 (L) 07/24/2017   Lab Results  Component Value Date   LDLCALC 95 07/24/2017   Lab Results  Component Value Date   TRIG 131 07/24/2017   Lab Results  Component Value Date   CHOLHDL 4.7 07/24/2017   Lab Results  Component Value Date   HGBA1C 5.8 (A)  04/27/2018       Assessment & Plan:   Problem List Items Addressed This Visit  None    Visit Diagnoses    Diarrhea, unspecified type    -  Primary   Relevant Orders   SARS-COV-2 RNA,(COVID-19) QUAL NAAT   SOB (shortness of breath)       Relevant Orders   SARS-COV-2 RNA,(COVID-19) QUAL NAAT   Suspected Covid-19 Virus Infection         Suspect COVID based on symptoms.  He felt very strongly that he wanted to be tested he was very concerned about his girlfriend who also works in healthcare she is also been sick.  Patient did drive by the office and nasal swab was performed in his truck.  Explained that he has to self quarantine until results are back and he agrees to do so.  Nardi works from home.  No orders of the defined types were placed in this encounter.     I discussed the assessment and treatment plan with the patient. The patient was provided an opportunity to ask questions and all were answered. The patient agreed with the plan and demonstrated an understanding of the instructions.   The patient was advised to call back or seek an in-person evaluation if the symptoms worsen or if the condition fails to improve as anticipated.   Beatrice Lecher, MD

## 2018-10-05 NOTE — Patient Instructions (Signed)
Person Under Monitoring Name: Adam Hamilton  Location: [place] [address]   Infection Prevention Recommendations for Individuals Confirmed to have, or Being Evaluated for, 2019 Novel Coronavirus (COVID-19) Infection Who Receive Care at Home  Individuals who are confirmed to have, or are being evaluated for, COVID-19 should follow the prevention steps below until a healthcare provider or local or state health department says they can return to normal activities.  Stay home except to get medical care You should restrict activities outside your home, except for getting medical care. Do not go to work, school, or public areas, and do not use public transportation or taxis.  Call ahead before visiting your doctor Before your medical appointment, call the healthcare provider and tell them that you have, or are being evaluated for, COVID-19 infection. This will help the healthcare provider's office take steps to keep other people from getting infected. Ask your healthcare provider to call the local or state health department.  Monitor your symptoms Seek prompt medical attention if your illness is worsening (e.g., difficulty breathing). Before going to your medical appointment, call the healthcare provider and tell them that you have, or are being evaluated for, COVID-19 infection. Ask your healthcare provider to call the local or state health department.  Wear a facemask You should wear a facemask that covers your nose and mouth when you are in the same room with other people and when you visit a healthcare provider. People who live with or visit you should also wear a facemask while they are in the same room with you.  Separate yourself from other people in your home As much as possible, you should stay in a different room from other people in your home. Also, you should use a separate bathroom, if available.  Avoid sharing household items You should  not share dishes, drinking glasses, cups, eating utensils, towels, bedding, or other items with other people in your home. After using these items, you should wash them thoroughly with soap and water.  Cover your coughs and sneezes Cover your mouth and nose with a tissue when you cough or sneeze, or you can cough or sneeze into your sleeve. Throw used tissues in a lined trash can, and immediately wash your hands with soap and water for at least 20 seconds or use an alcohol-based hand rub.  Wash your [facility] your hands often and thoroughly with soap and water for at least 20 seconds. You can use an alcohol-based hand sanitizer if soap and water are not available and if your hands are not visibly dirty. Avoid touching your eyes, nose, and mouth with unwashed hands.   Prevention Steps for Caregivers and Household Members of Individuals Confirmed to have, or Being Evaluated for, COVID-19 Infection Being Cared for in the Home  If you live with, or provide care at home for, a person confirmed to have, or being evaluated for, COVID-19 infection please follow these guidelines to prevent infection:  Follow healthcare provider's instructions Make sure that you understand and can help the patient follow any healthcare provider instructions for all care.  Provide for the patient's basic needs You should help the patient with basic needs in the home and provide support for getting groceries, prescriptions, and other personal needs.  Monitor the patient's symptoms If they are getting sicker, call his or her medical provider and tell them that the patient has, or is being evaluated for, COVID-19 infection. This will help the healthcare  provider's office take steps to keep other people from getting infected. Ask the healthcare provider to call the local or state health department.  Limit the number of people who have contact with the patient  If possible, have only one caregiver for the patient.   Other household members should stay in another home or place of residence. If this is not possible, they should stay  in another room, or be separated from the patient as much as possible. Use a separate bathroom, if available.  Restrict visitors who do not have an essential need to be in the home.  Keep older adults, very young children, and other sick people away from the patient Keep older adults, very young children, and those who have compromised immune systems or chronic health conditions away from the patient. This includes people with chronic heart, lung, or kidney conditions, diabetes, and cancer.  Ensure good ventilation Make sure that shared spaces in the home have good air flow, such as from an air conditioner or an opened window, weather permitting.  Wash your hands often  Wash your hands often and thoroughly with soap and water for at least 20 seconds. You can use an alcohol based hand sanitizer if soap and water are not available and if your hands are not visibly dirty.  Avoid touching your eyes, nose, and mouth with unwashed hands.  Use disposable paper towels to dry your hands. If not available, use dedicated cloth towels and replace them when they become wet.  Wear a facemask and gloves  Wear a disposable facemask at all times in the room and gloves when you touch or have contact with the patient's blood, body fluids, and/or secretions or excretions, such as sweat, saliva, sputum, nasal mucus, vomit, urine, or feces.  Ensure the mask fits over your nose and mouth tightly, and do not touch it during use.  Throw out disposable facemasks and gloves after using them. Do not reuse.  Wash your hands immediately after removing your facemask and gloves.  If your personal clothing becomes contaminated, carefully remove clothing and launder. Wash your hands after handling contaminated clothing.  Place all used disposable facemasks, gloves, and other waste in a lined  container before disposing them with other household waste.  Remove gloves and wash your hands immediately after handling these items.  Do not share dishes, glasses, or other household items with the patient  Avoid sharing household items. You should not share dishes, drinking glasses, cups, eating utensils, towels, bedding, or other items with a patient who is confirmed to have, or being evaluated for, COVID-19 infection.  After the person uses these items, you should wash them thoroughly with soap and water.  Wash laundry thoroughly  Immediately remove and wash clothes or bedding that have blood, body fluids, and/or secretions or excretions, such as sweat, saliva, sputum, nasal mucus, vomit, urine, or feces, on them.  Wear gloves when handling laundry from the patient.  Read and follow directions on labels of laundry or clothing items and detergent. In general, wash and dry with the warmest temperatures recommended on the label.  Clean all areas the individual has used often  Clean all touchable surfaces, such as counters, tabletops, doorknobs, bathroom fixtures, toilets, phones, keyboards, tablets, and bedside tables, every day. Also, clean any surfaces that may have blood, body fluids, and/or secretions or excretions on them.  Wear gloves when cleaning surfaces the patient has come in contact with.  Use a diluted bleach solution (e.g., dilute bleach with  1 part bleach and 10 parts water) or a household disinfectant with a label that says EPA-registered for coronaviruses. To make a bleach solution at home, add 1 tablespoon of bleach to 1 quart (4 cups) of water. For a larger supply, add  cup of bleach to 1 gallon (16 cups) of water.  Read labels of cleaning products and follow recommendations provided on product labels. Labels contain instructions for safe and effective use of the cleaning product including precautions you should take when applying the product, such as wearing gloves or  eye protection and making sure you have good ventilation during use of the product.  Remove gloves and wash hands immediately after cleaning.  Monitor yourself for signs and symptoms of illness Caregivers and household members are considered close contacts, should monitor their health, and will be asked to limit movement outside of the home to the extent possible. Follow the monitoring steps for close contacts listed on the symptom monitoring form.   ? If you have additional questions, contact your local health department or call the epidemiologist on call at [phone] (available 24/7). ? This guidance is subject to change. For the most up-to-date guidance from [facility], please refer to their website: [email or web address]

## 2018-10-05 NOTE — Progress Notes (Signed)
Pt reports sxs x4 days. No appetite, diarrhea on Friday, nauseated for a few days. He also reports a metallic taste.  No trouble breathing just feels like something heavy on his chest.  Pt is requesting to be tested due to his fiance working in healthcare.Elouise Munroe, [place]

## 2018-10-08 LAB — SARS-COV-2 RNA,(COVID-19) QUALITATIVE NAAT: SARS CoV2 RNA: NOT DETECTED

## 2018-10-26 ENCOUNTER — Telehealth (INDEPENDENT_AMBULATORY_CARE_PROVIDER_SITE_OTHER): Payer: BLUE CROSS/BLUE SHIELD | Admitting: Family Medicine

## 2018-10-26 ENCOUNTER — Encounter: Payer: Self-pay | Admitting: Family Medicine

## 2018-10-26 ENCOUNTER — Telehealth: Payer: Self-pay | Admitting: *Deleted

## 2018-10-26 VITALS — Temp 98.1°F | Ht 75.0 in | Wt 301.0 lb

## 2018-10-26 DIAGNOSIS — Z13220 Encounter for screening for lipoid disorders: Secondary | ICD-10-CM

## 2018-10-26 DIAGNOSIS — R1013 Epigastric pain: Secondary | ICD-10-CM

## 2018-10-26 DIAGNOSIS — R7303 Prediabetes: Secondary | ICD-10-CM

## 2018-10-26 DIAGNOSIS — G4733 Obstructive sleep apnea (adult) (pediatric): Secondary | ICD-10-CM

## 2018-10-26 DIAGNOSIS — M546 Pain in thoracic spine: Secondary | ICD-10-CM

## 2018-10-26 DIAGNOSIS — K760 Fatty (change of) liver, not elsewhere classified: Secondary | ICD-10-CM

## 2018-10-26 MED ORDER — AMBULATORY NON FORMULARY MEDICATION: 0 refills | Status: DC

## 2018-10-26 NOTE — Progress Notes (Signed)
Pt reports that he is doing better. 3-4 days last week he had some "gastric" issues and noticed that he was noticed some trace blood after BM's. This has since passed.   He has a hx of diverticulosis and reports that about a week or so before he had eaten some corn which he stated causes some issues.    Pt has not has had any more issues w/OSA. He hasn't been using the cpap hardly any lately. He would like to have the machine p/u so he won't be charged for this. I advised that he would need to speak w/Dr. Madilyn Fireman about this.Maryruth Eve, [place] Crocker, CMA

## 2018-10-26 NOTE — Progress Notes (Signed)
Virtual Visit via Video Note  I connected with Adam Hamilton on 10/26/18 at  8:30 AM EDT by a video enabled telemedicine application and verified that I am speaking with the correct person using two identifiers.   I discussed the limitations of evaluation and management by telemedicine and the availability of in person appointments. The patient expressed understanding and agreed to proceed.  Patient was at work and I was in my office for this visit today.  Subjective:    CC: glucose and OSA  HPI: Impaired fasting glucose-no increased thirst or urination. No symptoms consistent with hypoglycemia.  F/U OSA- admits he doesn't use it regularly Says if having a hard time sleeping and then uses it it is even harder to use it.   He has had some more stomach issues last week but is feeling some better today. He was going to work but it was hard.  Epigastric area and LUQ pain. TUMs would help some.  + burping a lot. Never vomited.  A little blood in the stool when would wipe. + Loose stools.   He still having anterior chest wall pain.  It is mostly bilateral he really feels like it probably is more musculoskeletal.  We discussed previously that it could actually be coming from his thoracic spine which is causing pain that wraps around to the anterior chest wall even over to the e axillary area   Past medical history, Surgical history, Family history not pertinant except as noted below, Social history, Allergies, and medications have been entered into the medical record, reviewed, and corrections made.   Review of Systems: No fevers, chills, night sweats, weight loss, chest pain, or shortness of breath.   Objective:    General: Speaking clearly in complete sentences without any shortness of breath.  Alert and oriented x3.  Normal judgment. No apparent acute distress.  Well-groomed.  Points to the epigastric area and left upper quadrant area as his area of pain and discomfort.    Impression and  Recommendations:    IFG - due to receck A1C. He is doing OK.  Has had dec appetite recently bc of stomach pain.    OSA - doing well on regimen. Doesn't use it every night.  He does want an order for them to come and pick up the oxygen concentrator.  Dyspepsia-epigastric pain-some radiation into the left upper quadrant.  He is feeling some better now. Occ taking a PPI, but not regularly.  He could try taking the PPI a little bit more regularly.  Says about the third episode he has had with this.  Consider pancreatitis as well as hepatitis and will check liver enzymes and pancreatic enzymes as well as a CBC.  Fatty liver - due to recheck liver enzymes.   Anterior chest wall pain-I still think it could be coming from the thoracic spine I would like to get an x-ray he can go at his convenience.    I discussed the assessment and treatment plan with the patient. The patient was provided an opportunity to ask questions and all were answered. The patient agreed with the plan and demonstrated an understanding of the instructions.   The patient was advised to call back or seek an in-person evaluation if the symptoms worsen or if the condition fails to improve as anticipated.   Beatrice Lecher, MD

## 2018-10-26 NOTE — Telephone Encounter (Signed)
Script printed,faxed,confirmation received .Elouise Munroe, [place]

## 2018-10-28 ENCOUNTER — Other Ambulatory Visit: Payer: Self-pay

## 2018-10-28 ENCOUNTER — Ambulatory Visit (INDEPENDENT_AMBULATORY_CARE_PROVIDER_SITE_OTHER): Payer: BLUE CROSS/BLUE SHIELD

## 2018-10-28 DIAGNOSIS — M546 Pain in thoracic spine: Secondary | ICD-10-CM | POA: Diagnosis not present

## 2018-10-28 DIAGNOSIS — M47815 Spondylosis without myelopathy or radiculopathy, thoracolumbar region: Secondary | ICD-10-CM | POA: Diagnosis not present

## 2018-10-28 DIAGNOSIS — R1013 Epigastric pain: Secondary | ICD-10-CM | POA: Diagnosis not present

## 2018-10-28 DIAGNOSIS — Z13220 Encounter for screening for lipoid disorders: Secondary | ICD-10-CM | POA: Diagnosis not present

## 2018-10-28 DIAGNOSIS — R7303 Prediabetes: Secondary | ICD-10-CM | POA: Diagnosis not present

## 2018-10-29 LAB — CBC
HCT: 44 % (ref 38.5–50.0)
Hemoglobin: 14.7 g/dL (ref 13.2–17.1)
MCH: 29.9 pg (ref 27.0–33.0)
MCHC: 33.4 g/dL (ref 32.0–36.0)
MCV: 89.6 fL (ref 80.0–100.0)
MPV: 10.7 fL (ref 7.5–12.5)
Platelets: 260 10*3/uL (ref 140–400)
RBC: 4.91 10*6/uL (ref 4.20–5.80)
RDW: 13 % (ref 11.0–15.0)
WBC: 12.3 10*3/uL — ABNORMAL HIGH (ref 3.8–10.8)

## 2018-10-29 LAB — COMPLETE METABOLIC PANEL WITH GFR
AG Ratio: 1.8 (calc) (ref 1.0–2.5)
ALT: 32 U/L (ref 9–46)
AST: 19 U/L (ref 10–40)
Albumin: 4.5 g/dL (ref 3.6–5.1)
Alkaline phosphatase (APISO): 56 U/L (ref 36–130)
BUN: 16 mg/dL (ref 7–25)
CO2: 29 mmol/L (ref 20–32)
Calcium: 9.9 mg/dL (ref 8.6–10.3)
Chloride: 103 mmol/L (ref 98–110)
Creat: 0.99 mg/dL (ref 0.60–1.35)
GFR, Est African American: 105 mL/min/{1.73_m2} (ref >=60)
GFR, Est Non African American: 90 mL/min/{1.73_m2} (ref >=60)
Globulin: 2.5 g/dL (calc) (ref 1.9–3.7)
Glucose, Bld: 109 mg/dL — ABNORMAL HIGH (ref 65–99)
Potassium: 4.6 mmol/L (ref 3.5–5.3)
Sodium: 139 mmol/L (ref 135–146)
Total Bilirubin: 0.3 mg/dL (ref 0.2–1.2)
Total Protein: 7 g/dL (ref 6.1–8.1)

## 2018-10-29 LAB — HEMOGLOBIN A1C
Hgb A1c MFr Bld: 5.6 % of total Hgb (ref <5.7)
Mean Plasma Glucose: 114 (calc)
eAG (mmol/L): 6.3 (calc)

## 2018-10-29 LAB — LIPID PANEL
Cholesterol: 136 mg/dL (ref <200)
HDL: 33 mg/dL — ABNORMAL LOW (ref >=40)
LDL Cholesterol (Calc): 81 mg/dL (calc)
Non-HDL Cholesterol (Calc): 103 mg/dL (calc) (ref <130)
Total CHOL/HDL Ratio: 4.1 (calc) (ref <5.0)
Triglycerides: 122 mg/dL (ref <150)

## 2018-10-29 LAB — LIPASE: Lipase: 30 U/L (ref 7–60)

## 2018-11-09 ENCOUNTER — Encounter: Payer: Self-pay | Admitting: Family Medicine

## 2018-11-09 DIAGNOSIS — R1013 Epigastric pain: Secondary | ICD-10-CM

## 2018-11-10 ENCOUNTER — Other Ambulatory Visit: Payer: Self-pay | Admitting: Family Medicine

## 2018-11-10 MED ORDER — OMEPRAZOLE 40 MG PO CPDR: 40.0000 mg | DELAYED_RELEASE_CAPSULE | Freq: Every day | ORAL | 1 refills | Status: DC

## 2018-11-10 MED ORDER — SUCRALFATE 1 GM/10ML PO SUSP: 1.0000 g | Freq: Three times a day (TID) | ORAL | 0 refills | Status: DC

## 2018-11-16 ENCOUNTER — Ambulatory Visit: Payer: BLUE CROSS/BLUE SHIELD | Admitting: Family Medicine

## 2018-11-23 ENCOUNTER — Other Ambulatory Visit: Payer: Self-pay | Admitting: Family Medicine

## 2018-11-23 NOTE — Telephone Encounter (Signed)
Rx filled 1 wk ago. Maryruth Eve, [place] Crocker, CMA

## 2018-11-26 DIAGNOSIS — K581 Irritable bowel syndrome with constipation: Secondary | ICD-10-CM | POA: Diagnosis not present

## 2018-11-26 DIAGNOSIS — R1013 Epigastric pain: Secondary | ICD-10-CM | POA: Diagnosis not present

## 2018-11-26 DIAGNOSIS — Z6790 Unspecified blood type, Rh positive: Secondary | ICD-10-CM | POA: Diagnosis not present

## 2018-11-26 DIAGNOSIS — K219 Gastro-esophageal reflux disease without esophagitis: Secondary | ICD-10-CM | POA: Diagnosis not present

## 2018-11-26 DIAGNOSIS — K625 Hemorrhage of anus and rectum: Secondary | ICD-10-CM | POA: Diagnosis not present

## 2018-11-26 DIAGNOSIS — K7581 Nonalcoholic steatohepatitis (NASH): Secondary | ICD-10-CM | POA: Diagnosis not present

## 2018-12-02 DIAGNOSIS — D125 Benign neoplasm of sigmoid colon: Secondary | ICD-10-CM | POA: Diagnosis not present

## 2018-12-02 DIAGNOSIS — Z800 Family history of malignant neoplasm of digestive organs: Secondary | ICD-10-CM | POA: Diagnosis not present

## 2018-12-02 DIAGNOSIS — R1013 Epigastric pain: Secondary | ICD-10-CM | POA: Diagnosis not present

## 2018-12-02 DIAGNOSIS — K2950 Unspecified chronic gastritis without bleeding: Secondary | ICD-10-CM | POA: Diagnosis not present

## 2018-12-02 DIAGNOSIS — K219 Gastro-esophageal reflux disease without esophagitis: Secondary | ICD-10-CM | POA: Diagnosis not present

## 2018-12-23 DIAGNOSIS — K219 Gastro-esophageal reflux disease without esophagitis: Secondary | ICD-10-CM | POA: Diagnosis not present

## 2018-12-23 DIAGNOSIS — Z6790 Unspecified blood type, Rh positive: Secondary | ICD-10-CM | POA: Diagnosis not present

## 2018-12-23 DIAGNOSIS — K581 Irritable bowel syndrome with constipation: Secondary | ICD-10-CM | POA: Diagnosis not present

## 2018-12-23 DIAGNOSIS — R1013 Epigastric pain: Secondary | ICD-10-CM | POA: Diagnosis not present

## 2018-12-27 ENCOUNTER — Other Ambulatory Visit: Payer: Self-pay | Admitting: Family Medicine

## 2019-02-01 ENCOUNTER — Other Ambulatory Visit: Payer: Self-pay | Admitting: Family Medicine

## 2019-02-16 ENCOUNTER — Other Ambulatory Visit: Payer: Self-pay

## 2019-02-16 ENCOUNTER — Emergency Department (INDEPENDENT_AMBULATORY_CARE_PROVIDER_SITE_OTHER)
Admission: EM | Admit: 2019-02-16 | Discharge: 2019-02-16 | Disposition: A | Discharge status: Home / Self Care | Payer: BLUE CROSS/BLUE SHIELD | Source: Home / Self Care | Attending: Emergency Medicine | Admitting: Emergency Medicine

## 2019-02-16 DIAGNOSIS — H16002 Unspecified corneal ulcer, left eye: Secondary | ICD-10-CM | POA: Diagnosis not present

## 2019-02-16 MED ORDER — OFLOXACIN 0.3 % OP SOLN: 1.0000 [drp] | Freq: Four times a day (QID) | OPHTHALMIC | 0 refills | Status: AC

## 2019-02-16 NOTE — Discharge Instructions (Signed)
Use antibiotic drops as instructed. Please see your eye physician in the next 24 to 48 hours for follow-up and reevaluation.

## 2019-02-16 NOTE — ED Triage Notes (Signed)
Pt was working on the house this weekend, and left eye is red and had a "pressure" sensation for the weekend.

## 2019-02-16 NOTE — ED Provider Notes (Signed)
[name]    CSN: [id number] Arrival date & time: 02/16/19  0930      History   Chief Complaint Chief Complaint  Patient presents with  . Eye Problem    HPI Adam Hamilton is a 48 y.o. male.   HPI Patient enters with pain and discomfort in his right.  His right eye has been red.  He has a sensation of a foreign body initially but this has resolved.  His eye pain is somewhat better but still seems somewhat distorted. Past Medical History:  Diagnosis Date  . Fatty liver 07/23/2017   Demonstrated on RUQ [place]  . Kidney stones   . Nocturnal hypoxia 08/29/2017  . OSA (obstructive sleep apnea)   . Postural dizziness   . Prediabetes 07/29/2017    Patient Active Problem List   Diagnosis Date Noted  . S/P ASA (advanced surface ablation) surgery PRK (photorefractive keratectomy) 09/21/2017  . 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 Nocturnal hypoxia 08/29/2017  . Prediabetes 07/29/2017  . Hyperglycemia 07/25/2017  . Tinea corporis 07/23/2017  . Postural dizziness with presyncope 07/23/2017  . Heart palpitations 07/23/2017  . Orthostasis 07/23/2017  . Fatty liver 07/23/2017  . History of cardiovascular stress test 07/23/2017  . Obstructive sleep apnea of adult 07/23/2017  . History of nephrolithiasis 07/23/2017  . Myopia with astigmatism and presbyopia, bilateral 03/10/2017  . CN (constipation) 08/01/2014  . Umbilical hernia without obstruction and without gangrene 08/01/2014  . Obesity 03/21/2014  . Change in bowel function 07/08/2013  . Rectal bleeding 07/08/2013  . Dizzy spells 12/26/2010    Past Surgical History:  Procedure Laterality Date  . KNEE SURGERY  2011  . LITHOTRIPSY     age 49  . PALATE SURGERY     for snoring  . TONSILLECTOMY AND ADENOIDECTOMY     For snoring.        Home Medications    Prior to Admission medications   Medication Sig Start Date End Date Taking? Authorizing Provider  AMBULATORY NON FORMULARY MEDICATION  Medication Name: Discontinue oxygen concentrator.  Please pick up the oxygen concentrator.  [facility]ax order to [facility]. 10/26/18   Hali Marry, MD  b complex vitamins tablet Take 1 tablet by mouth daily.    [provider]  magnesium citrate SOLN Take 1 Bottle by mouth once.    [provider]  Multiple Vitamin (MULTIVITAMIN) capsule Take 1 capsule by mouth daily.    [provider]  ofloxacin (OCUFLOX) 0.3 % ophthalmic solution Place 1 drop into the left eye 4 (four) times daily. 02/16/19   Darlyne Russian, MD  omeprazole (PRILOSEC) 40 MG capsule Take 1 capsule (40 mg total) by mouth daily. 11/10/18   Hali Marry, MD  sucralfate (CARAFATE) 1 GM/10ML suspension TAKE 10 MLS (1 G TOTAL) BY MOUTH 4 (FOUR) TIMES DAILY - WITH MEALS AND AT BEDTIME. 02/01/19   Hali Marry, MD    Family History Family History  Problem Relation Age of Onset  . Stroke Mother 79  . Hypertension Mother   . Colon polyps Mother   . Pulmonary fibrosis Mother   . Diabetes type I Mother   . Stomach cancer Father 40  . Lymphoma Paternal Grandfather 50  . Prostate cancer Paternal Grandfather   . Heart attack Neg Hx   . Hyperlipidemia Neg Hx   . Sudden death Neg Hx     Social History Social History   Tobacco Use  . Smoking  status: Former Smoker    Packs/day: 0.50    Years: 20.00    Pack years: 10.00    Quit date: 01/13/2012    Years since quitting: 7.0  . Smokeless tobacco: Never Used  Substance Use Topics  . Alcohol use: Yes    Alcohol/week: 1.0 - 5.0 standard drinks    Types: 1 - 5 drink(s) per week    Comment: Rarely  . Drug use: No     Allergies   Linzess [linaclotide]   Review of Systems Review of Systems  Constitutional: Negative.   Eyes: Positive for photophobia, pain, redness and visual disturbance.  Respiratory: Negative.   Cardiovascular: Negative.      Physical Exam Triage Vital Signs ED Triage Vitals  Enc Vitals Group     BP 02/16/19  1015 121/73     Pulse Rate 02/16/19 1015 73     Resp 02/16/19 1015 20     Temp 02/16/19 1015 98.4 F (36.9 C)     Temp Source 02/16/19 1015 Oral     SpO2 02/16/19 1015 97 %     Weight 02/16/19 1017 293 lb (132.9 kg)     Height 02/16/19 1017 6\' 4"  (1.93 m)     Head Circumference --      Peak Flow --      Pain Score 02/16/19 1016 1     Pain Loc --      Pain Edu? --      Excl. in [place]? --    No data found.  Updated Vital Signs BP 121/73 (BP Location: Right Arm)   Pulse 73   Temp 98.4 F (36.9 C) (Oral)   Resp 20   Ht 6\' 4"  (1.93 m)   Wt 132.9 kg   SpO2 97%   BMI 35.67 kg/m   Visual Acuity Right Eye Distance: 20/20 Left Eye Distance: 20/15 Bilateral Distance: 20/15  Right Eye Near:   Left Eye Near:    Bilateral Near:     Physical Exam Constitutional:      Appearance: Normal appearance.  HENT:     Head: Normocephalic.  Eyes:     Comments: Pupils are equal and reactive.  There is conjunctival injection.  2 drops of anesthetic were instilled.  Lid was everted and swabbed.  Visualization with the ophthalmoscope reveals a 2 mm corneal ulcer in the field of vision.  This was almost directly over the pupil slightly down and to the left.  Neurological:     Mental Status: He is alert.      UC Treatments / Results  Labs (all labs ordered are listed, but only abnormal results are displayed) Labs Reviewed - No data to display  EKG   Radiology No results found.  Procedures Procedures (including critical care time)  Medications Ordered in UC Medications - No data to display  Initial Impression / Assessment and Plan / UC Course  I have reviewed the triage vital signs and the nursing notes. Patient has what appears to be a corneal ulcer.  He will be started on antibiotic drops with instructions to follow-up with his eye physician in the next 24 to 48 hours.  Visual acuity was 20/15. Pertinent labs & imaging results that were available during my care of the patient  were reviewed by me and considered in my medical decision making (see chart for details).      Final Clinical Impressions(s) / UC Diagnoses   Final diagnoses:  Corneal ulcer of left eye  Discharge Instructions     Use antibiotic drops as instructed. Please see your eye physician in the next 24 to 48 hours for follow-up and reevaluation.    ED Prescriptions    Medication Sig Dispense Auth. Provider   ofloxacin (OCUFLOX) 0.3 % ophthalmic solution Place 1 drop into the left eye 4 (four) times daily. 5 mL Darlyne Russian, MD     Controlled Substance Prescriptions [place] Controlled Substance Registry consulted? Not Applicable   Darlyne Russian, MD 02/16/19 [phone]

## 2019-02-22 DIAGNOSIS — S0502XA Injury of conjunctiva and corneal abrasion without foreign body, left eye, initial encounter: Secondary | ICD-10-CM | POA: Diagnosis not present

## 2019-03-14 ENCOUNTER — Other Ambulatory Visit: Payer: Self-pay | Admitting: Family Medicine

## 2019-05-06 ENCOUNTER — Other Ambulatory Visit: Payer: Self-pay | Admitting: Family Medicine

## 2019-07-12 DIAGNOSIS — Z20822 Contact with and (suspected) exposure to covid-19: Secondary | ICD-10-CM | POA: Diagnosis not present

## 2019-07-12 DIAGNOSIS — B349 Viral infection, unspecified: Secondary | ICD-10-CM | POA: Diagnosis not present

## 2019-08-04 DIAGNOSIS — Z20822 Contact with and (suspected) exposure to covid-19: Secondary | ICD-10-CM | POA: Diagnosis not present

## 2019-08-29 DIAGNOSIS — U071 COVID-19: Secondary | ICD-10-CM | POA: Diagnosis not present

## 2019-08-29 DIAGNOSIS — J029 Acute pharyngitis, unspecified: Secondary | ICD-10-CM | POA: Diagnosis not present

## 2019-08-29 DIAGNOSIS — R05 Cough: Secondary | ICD-10-CM | POA: Diagnosis not present

## 2019-08-29 DIAGNOSIS — R0981 Nasal congestion: Secondary | ICD-10-CM | POA: Diagnosis not present

## 2019-09-10 ENCOUNTER — Other Ambulatory Visit: Payer: Self-pay | Admitting: Family Medicine

## 2019-09-18 DIAGNOSIS — Z23 Encounter for immunization: Secondary | ICD-10-CM | POA: Diagnosis not present

## 2019-11-03 DIAGNOSIS — R1013 Epigastric pain: Secondary | ICD-10-CM | POA: Diagnosis not present

## 2019-11-03 DIAGNOSIS — R1310 Dysphagia, unspecified: Secondary | ICD-10-CM | POA: Diagnosis not present

## 2019-11-03 DIAGNOSIS — K219 Gastro-esophageal reflux disease without esophagitis: Secondary | ICD-10-CM | POA: Diagnosis not present

## 2019-11-03 DIAGNOSIS — Z800 Family history of malignant neoplasm of digestive organs: Secondary | ICD-10-CM | POA: Diagnosis not present

## 2019-11-10 ENCOUNTER — Encounter: Payer: Self-pay | Admitting: Family Medicine

## 2019-11-10 DIAGNOSIS — R1310 Dysphagia, unspecified: Secondary | ICD-10-CM | POA: Diagnosis not present

## 2019-11-24 DIAGNOSIS — Z800 Family history of malignant neoplasm of digestive organs: Secondary | ICD-10-CM | POA: Diagnosis not present

## 2019-11-24 DIAGNOSIS — Z7183 Encounter for nonprocreative genetic counseling: Secondary | ICD-10-CM | POA: Diagnosis not present

## 2019-11-24 DIAGNOSIS — D126 Benign neoplasm of colon, unspecified: Secondary | ICD-10-CM | POA: Diagnosis not present

## 2020-03-16 ENCOUNTER — Telehealth: Payer: Self-pay

## 2020-03-16 NOTE — Telephone Encounter (Signed)
PA submitted through cover my meds for Sucralfate.

## 2020-03-19 ENCOUNTER — Other Ambulatory Visit: Payer: Self-pay | Admitting: Family Medicine

## 2020-03-21 NOTE — Telephone Encounter (Signed)
PA denied. Placed paperwork in basket.

## 2020-03-21 NOTE — Telephone Encounter (Signed)
Since he is followed by GI and I havent' seen him I would recommend he call them since the carafate liquid hasn't been covered by the insurance

## 2020-03-23 NOTE — Telephone Encounter (Signed)
Patient advised.

## 2020-04-10 ENCOUNTER — Telehealth: Payer: Self-pay | Admitting: Family Medicine

## 2020-04-10 MED ORDER — SUCRALFATE 1 G PO TABS: 1.0000 g | ORAL_TABLET | Freq: Three times a day (TID) | ORAL | 2 refills | Status: DC

## 2020-04-10 NOTE — Telephone Encounter (Signed)
Call pt: carafate liquid was declined by insurance. Tabs sent to pharmacy

## 2020-04-11 NOTE — Telephone Encounter (Signed)
[place] follow up with GI and they have taken care of the prescription.

## 2020-05-02 DIAGNOSIS — Z20822 Contact with and (suspected) exposure to covid-19: Secondary | ICD-10-CM | POA: Diagnosis not present

## 2020-05-05 DIAGNOSIS — Z20822 Contact with and (suspected) exposure to covid-19: Secondary | ICD-10-CM | POA: Diagnosis not present

## 2020-07-10 IMAGING — DX DG CHEST 2V
2 series · 2 of 2 positions shown · non-contrast
Comparison: January 27, 2014

CLINICAL DATA: Fever with cough and congestion

EXAM:
CHEST - 2 VIEW

[chest pa]
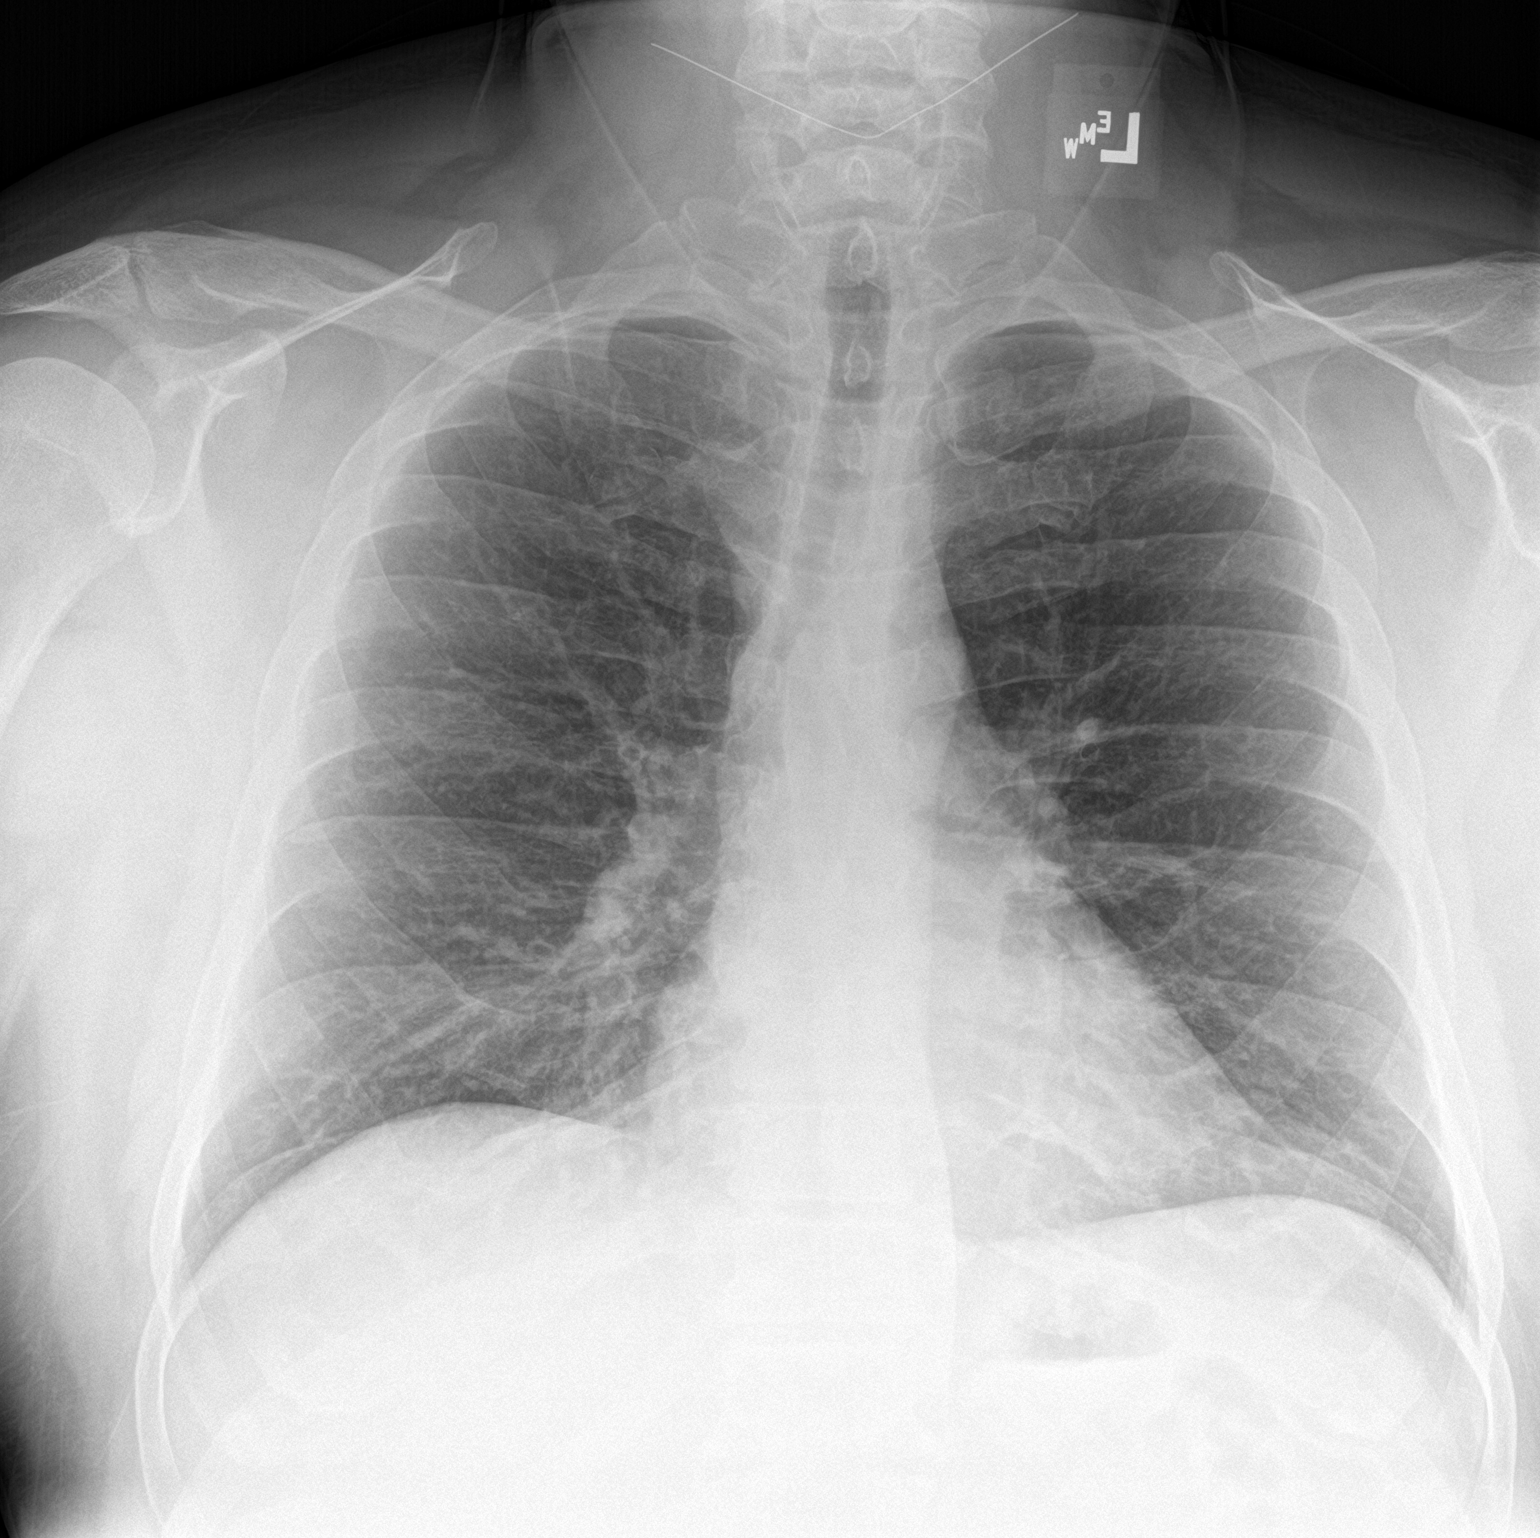

[chest lat]
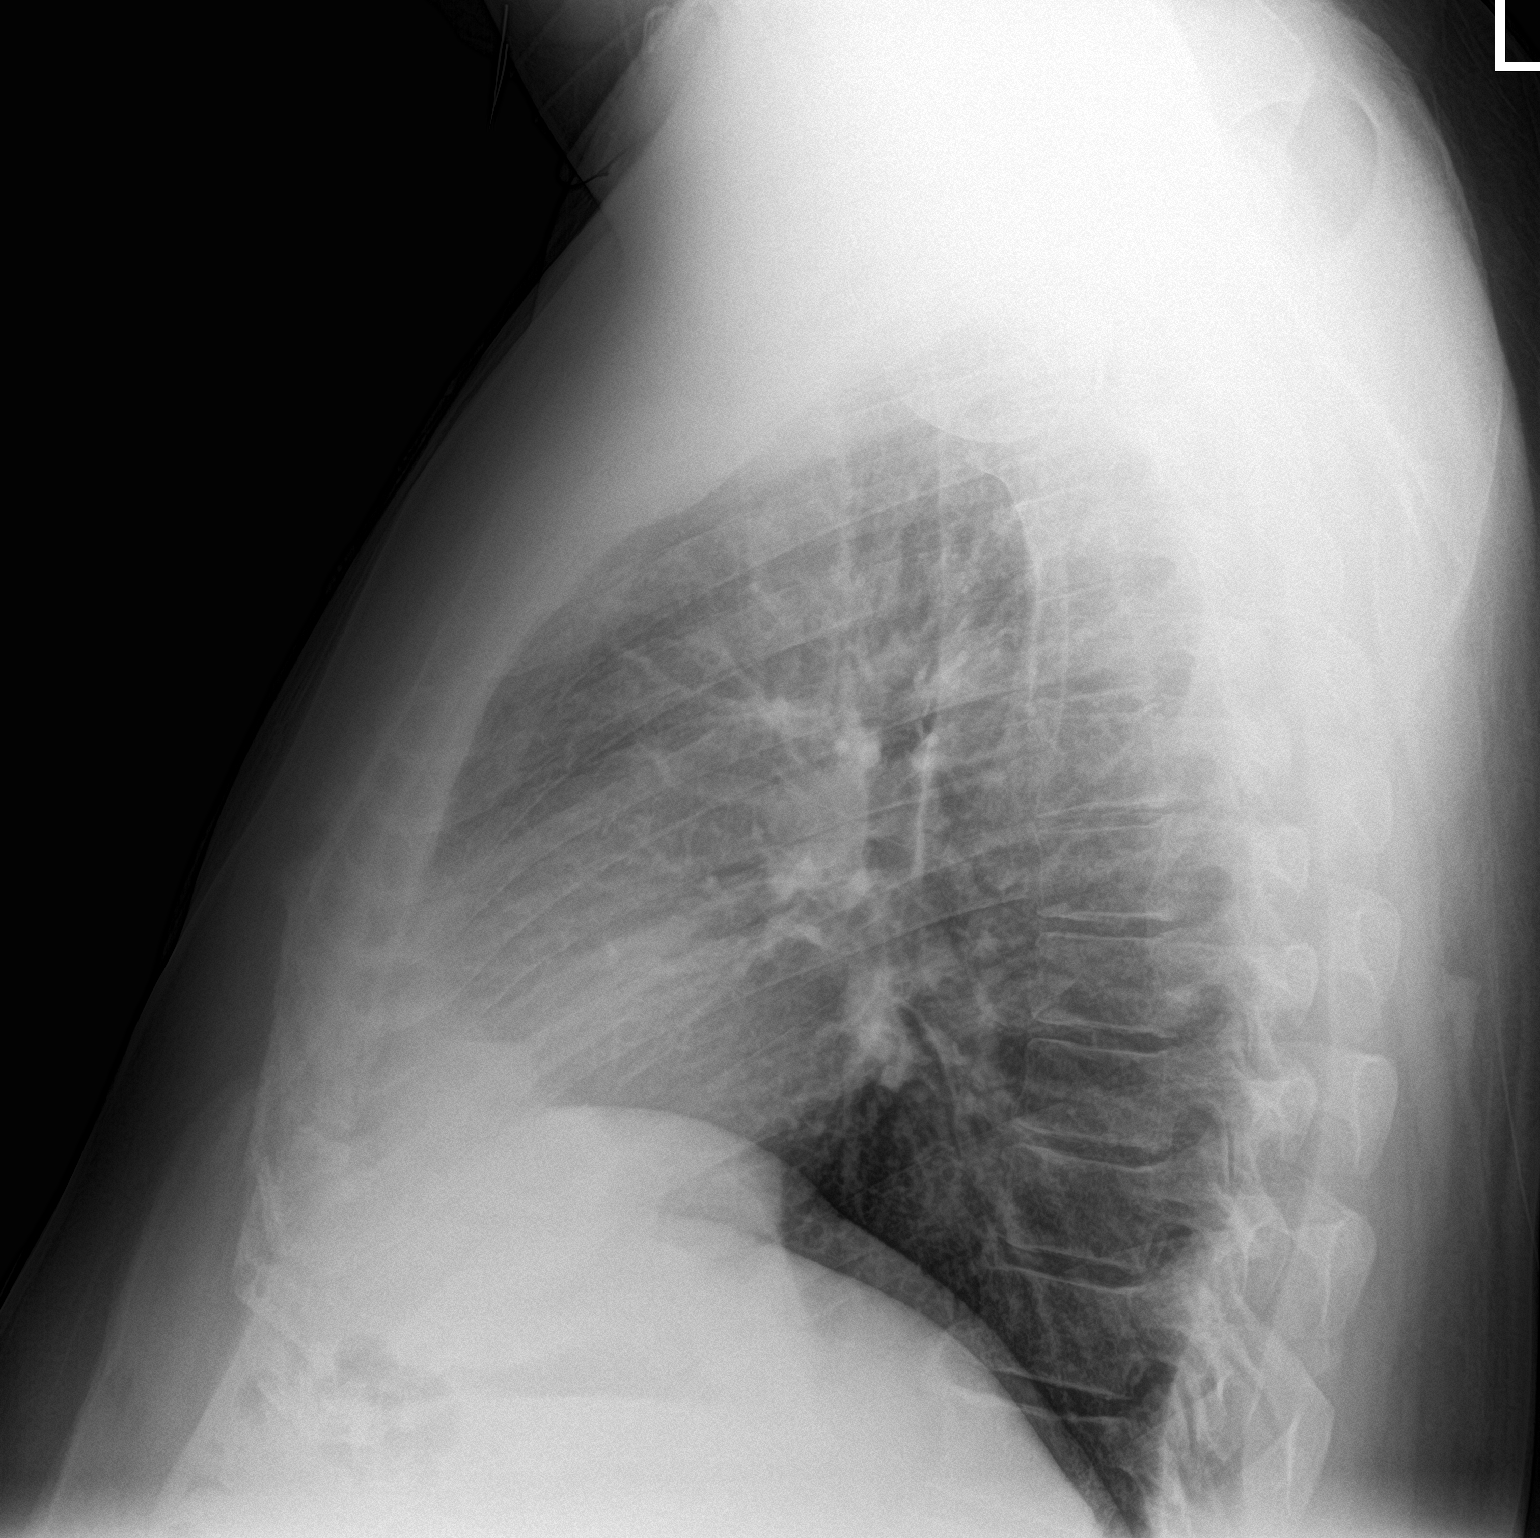

[2 of 2 positions shown; findings below may reference images not displayed]

FINDINGS: Lungs are clear. The heart size and pulmonary vascularity are
normal. No adenopathy. No bone lesions.
IMPRESSION: No edema or consolidation.

## 2020-07-17 ENCOUNTER — Other Ambulatory Visit: Payer: Self-pay | Admitting: Family Medicine

## 2021-01-01 ENCOUNTER — Ambulatory Visit (INDEPENDENT_AMBULATORY_CARE_PROVIDER_SITE_OTHER): Payer: BC Managed Care – PPO | Admitting: Sports Medicine

## 2021-01-01 ENCOUNTER — Ambulatory Visit (INDEPENDENT_AMBULATORY_CARE_PROVIDER_SITE_OTHER): Payer: BC Managed Care – PPO

## 2021-01-01 ENCOUNTER — Other Ambulatory Visit: Payer: Self-pay

## 2021-01-01 DIAGNOSIS — R200 Anesthesia of skin: Secondary | ICD-10-CM

## 2021-01-01 DIAGNOSIS — R202 Paresthesia of skin: Secondary | ICD-10-CM

## 2021-01-01 DIAGNOSIS — G5622 Lesion of ulnar nerve, left upper limb: Secondary | ICD-10-CM | POA: Insufficient documentation

## 2021-01-01 MED ORDER — GABAPENTIN 300 MG PO CAPS: ORAL_CAPSULE | ORAL | 3 refills | Status: AC

## 2021-01-01 MED ORDER — PREDNISONE 50 MG PO TABS: ORAL_TABLET | ORAL | 0 refills | Status: DC

## 2021-01-01 NOTE — Progress Notes (Signed)
    Procedures performed today:    None.  Independent interpretation of notes and tests performed by another provider:   None.  Brief History, Exam, Impression, and Recommendations:    Numbness and tingling in left hand This is a pleasant 50 year old male, he works from a computer, the past 6 months has had increasing numbness and tingling in his left hand, small finger and ulnar half of the ring finger. Not particularly worse at night, he does endorse some discomfort in his neck. He has no hypothenar atrophy, good strength, he does have a minimally positive Tinel sign at the cubital tunnel, he also has a positive Spurling sign at the neck. I am not exactly sure the site of his compression but this is either a C8 or an ulnar neuropathy. We will get cervical spine x-rays, prednisone, gabapentin, considering duration of his symptomatology I would like a nerve conduction/EMG of the left upper extremity. Home exercises for the cervical spine and ulnar gliding exercises. Return to see me in 4 weeks.    ___________________________________________ Gwen Her. Dianah Field, M.D., ABFM., CAQSM. Primary Care and [place] Instructor of [employer] [facility]

## 2021-01-01 NOTE — Assessment & Plan Note (Signed)
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 Not particularly worse at night, he does endorse some discomfort in his neck. He has no hypothenar atrophy, good strength, he does have a minimally positive Tinel sign at the cubital tunnel, he also has a positive Spurling sign at the neck. I am not exactly sure the site of his compression but this is either a C8 or an ulnar neuropathy. We will get cervical spine x-rays, prednisone, gabapentin, considering duration of his symptomatology I would like a nerve conduction/EMG of the left upper extremity. Home exercises for the cervical spine and ulnar gliding exercises. Return to see me in 4 weeks.

## 2021-01-15 DIAGNOSIS — R200 Anesthesia of skin: Secondary | ICD-10-CM

## 2021-01-15 DIAGNOSIS — R202 Paresthesia of skin: Secondary | ICD-10-CM

## 2021-01-24 NOTE — Telephone Encounter (Signed)
It is under the procedures tab placed 01/01/2021.

## 2021-01-26 NOTE — Telephone Encounter (Signed)
Done.  Please let patient know we are working on it.

## 2021-01-29 ENCOUNTER — Ambulatory Visit (INDEPENDENT_AMBULATORY_CARE_PROVIDER_SITE_OTHER): Payer: BC Managed Care – PPO

## 2021-01-29 ENCOUNTER — Other Ambulatory Visit: Payer: Self-pay

## 2021-01-29 ENCOUNTER — Ambulatory Visit (INDEPENDENT_AMBULATORY_CARE_PROVIDER_SITE_OTHER): Payer: BC Managed Care – PPO | Admitting: Sports Medicine

## 2021-01-29 ENCOUNTER — Ambulatory Visit: Payer: Self-pay

## 2021-01-29 DIAGNOSIS — R200 Anesthesia of skin: Secondary | ICD-10-CM

## 2021-01-29 DIAGNOSIS — M545 Low back pain, unspecified: Secondary | ICD-10-CM

## 2021-01-29 DIAGNOSIS — R202 Paresthesia of skin: Secondary | ICD-10-CM

## 2021-01-29 MED ORDER — IBUPROFEN 800 MG PO TABS: 800.0000 mg | ORAL_TABLET | Freq: Three times a day (TID) | ORAL | 2 refills | Status: DC | PRN

## 2021-01-29 NOTE — Progress Notes (Signed)
    Procedures performed today:    Procedure: Real-time Ultrasound Guided injection of the right sacroiliac joint Device: Samsung HS60  Verbal informed consent obtained.  Time-out conducted.  Noted no overlying erythema, induration, or other signs of local infection.  Skin prepped in a sterile fashion.  Local anesthesia: Topical Ethyl chloride.  With sterile technique and under real time ultrasound guidance: Noted minimally arthritic joint, 1 cc Kenalog 40, 2 cc lidocaine, 2 cc bupivacaine injected easily Completed without difficulty  Advised to call if fevers/chills, erythema, induration, drainage, or persistent bleeding.  Images permanently stored and available for review in PACS.  Impression: Technically successful ultrasound guided injection.  Independent interpretation of notes and tests performed by another provider:   None.  Brief History, Exam, Impression, and Recommendations:    Acute right-sided low back pain Adam Hamilton is a pleasant 50 year old male, he has acute onset right-sided low back pain localized at his SI joint, better with flexion and sitting. Radiates to the buttock and thigh but not past the knee. He has tried some oral medication without much improvement, today we injected his right sacroiliac joint, adding prescription strength ibuprofen, x-rays, sacroiliac joint conditioning exercises, return to see me in a month, we will likely advance him to more aggressive core strengthening if insufficiently better.  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 Not really worse at night, there was some neck pain. He had good strength, he had a minimally positive Tinel sign at the cubital tunnel, a positive Spurling sign in the neck, my suspicion was that this was a C8 radiculopathy. X-rays did show cervical degenerative changes, he is currently doing  gabapentin 2 pills at night, he will space it out to twice daily and then up taper. We are still awaiting nerve conduction/EMG. I do suspect we will be getting an MRI of his cervical spine and proceeding with an epidural.    ___________________________________________ Gwen Her. Dianah Field, M.D., ABFM., CAQSM. Primary Care and [place] Instructor of [place] of [facility] of Medicine

## 2021-01-29 NOTE — Assessment & Plan Note (Signed)
Adam Hamilton is a pleasant 50 year old male, he has acute onset right-sided low back pain localized at his SI joint, better with flexion and sitting. Radiates to the buttock and thigh but not past the knee. He has tried some oral medication without much improvement, today we injected his right sacroiliac joint, adding prescription strength ibuprofen, x-rays, sacroiliac joint conditioning exercises, return to see me in a month, we will likely advance him to more aggressive core strengthening if insufficiently better.

## 2021-01-29 NOTE — Assessment & Plan Note (Signed)
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 Not really worse at night, there was some neck pain. He had good strength, he had a minimally positive Tinel sign at the cubital tunnel, a positive Spurling sign in the neck, my suspicion was that this was a C8 radiculopathy. X-rays did show cervical degenerative changes, he is currently doing gabapentin 2 pills at night, he will space it out to twice daily and then up taper. We are still awaiting nerve conduction/EMG. I do suspect we will be getting an MRI of his cervical spine and proceeding with an epidural.

## 2021-02-23 ENCOUNTER — Telehealth: Payer: Self-pay | Admitting: Sports Medicine

## 2021-02-23 DIAGNOSIS — G5622 Lesion of ulnar nerve, left upper limb: Secondary | ICD-10-CM

## 2021-02-23 NOTE — Assessment & Plan Note (Signed)
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 I have recommended nighttime splinting for the wrist as well as his elbow. Continue gabapentin and to capsules at night. If insufficient improvement we will consider injections/Hydro dissections around the median nerve at the wrist and the ulnar nerve at the elbow.

## 2021-02-23 NOTE — Telephone Encounter (Signed)
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 I have recommended nighttime splinting for the wrist as well as his elbow. Continue gabapentin and to capsules at night. If insufficient improvement we will consider injections/Hydro dissections around the median nerve at the wrist and the ulnar nerve at the elbow.

## 2021-02-26 ENCOUNTER — Other Ambulatory Visit: Payer: Self-pay

## 2021-02-26 ENCOUNTER — Ambulatory Visit (INDEPENDENT_AMBULATORY_CARE_PROVIDER_SITE_OTHER): Payer: BC Managed Care – PPO

## 2021-02-26 ENCOUNTER — Ambulatory Visit (INDEPENDENT_AMBULATORY_CARE_PROVIDER_SITE_OTHER): Payer: BC Managed Care – PPO | Admitting: Sports Medicine

## 2021-02-26 DIAGNOSIS — G5622 Lesion of ulnar nerve, left upper limb: Secondary | ICD-10-CM

## 2021-02-26 DIAGNOSIS — M545 Low back pain, unspecified: Secondary | ICD-10-CM | POA: Diagnosis not present

## 2021-02-26 NOTE — Progress Notes (Signed)
    Procedures performed today:    Procedure: Real-time Ultrasound Guided hydrodissection of the left ulnar nerve at the cubital tunnel Device: Samsung HS60 Verbal informed consent obtained.  Time-out conducted.  Noted no overlying erythema, induration, or other signs of local infection.  Skin prepped in a sterile fashion.  Local anesthesia: Topical Ethyl chloride.  With sterile technique and under real time ultrasound guidance: Noted normal-appearing ulnar nerve.  Using a 25-gauge needle advanced into the carpal tunnel, taking care to avoid intraneural injection I injected medication both superficial to and deep to the ulnar nerve freeing it from surrounding structures, for a total of 1 cc kenalog 40, 5 cc 1% lidocaine without epinephrine. Completed without difficulty  Advised to call if fevers/chills, erythema, induration, drainage, or persistent bleeding.  Images permanently stored and available for review in PACS.  Impression: Technically successful ultrasound guided cubital tunnel ulnar nerve hydrodissection.  Independent interpretation of notes and tests performed by another provider:   None.  Brief History, Exam, Impression, and Recommendations:    Cubital tunnel syndrome, left Adam Hamilton returns, he is a pleasant 50 year old male, works from a computer, for approximately 9 months now he has had numbness and tingling in the left hand, predominantly ulnar distribution, ultimately EMG and nerve conduction showed cubital tunnel syndrome and carpal tunnel syndrome, symptoms are predominantly ulnar aspect of the hand. He has tried nighttime elbow splinting for a month without efficacy, today we performed an ulnar nerve Hydro dissection at the cubital tunnel, if insufficient improvement after a month we will refer for ulnar nerve transposition. Of note he was not able to tolerate the gabapentin.  Acute right-sided low back pain Adam Hamilton also had noted acute onset right-sided low back pain at  the last visit, on exam this was referable to the sacroiliac joint, we injected this and he was pain-free, he did have a little slipped down the stairs but this improved, and he is feeling well. He still has a bit of discomfort and clumsiness in his legs that I suspect is related to some degree of lumbar spinal stenosis, at this point he feels like he can live with it so we will hold off on additional imaging of the lumbar spine.    ___________________________________________ Adam Hamilton. Adam Hamilton, M.D., ABFM., CAQSM. Primary Care and [place] Instructor of [place] of [facility] of Medicine

## 2021-02-26 NOTE — Assessment & Plan Note (Signed)
Adam Hamilton returns, he is a pleasant 50 year old male, works from a computer, for approximately 9 months now he has had numbness and tingling in the left hand, predominantly ulnar distribution, ultimately EMG and nerve conduction showed cubital tunnel syndrome and carpal tunnel syndrome, symptoms are predominantly ulnar aspect of the hand. He has tried nighttime elbow splinting for a month without efficacy, today we performed an ulnar nerve Hydro dissection at the cubital tunnel, if insufficient improvement after a month we will refer for ulnar nerve transposition. Of note he was not able to tolerate the gabapentin.

## 2021-02-26 NOTE — Assessment & Plan Note (Signed)
Adam Hamilton also had noted acute onset right-sided low back pain at the last visit, on exam this was referable to the sacroiliac joint, we injected this and he was pain-free, he did have a little slipped down the stairs but this improved, and he is feeling well. He still has a bit of discomfort and clumsiness in his legs that I suspect is related to some degree of lumbar spinal stenosis, at this point he feels like he can live with it so we will hold off on additional imaging of the lumbar spine.

## 2021-03-26 ENCOUNTER — Ambulatory Visit (INDEPENDENT_AMBULATORY_CARE_PROVIDER_SITE_OTHER): Payer: BC Managed Care – PPO | Admitting: Sports Medicine

## 2021-03-26 ENCOUNTER — Other Ambulatory Visit: Payer: Self-pay

## 2021-03-26 DIAGNOSIS — M545 Low back pain, unspecified: Secondary | ICD-10-CM

## 2021-03-26 DIAGNOSIS — G5622 Lesion of ulnar nerve, left upper limb: Secondary | ICD-10-CM | POA: Diagnosis not present

## 2021-03-26 NOTE — Assessment & Plan Note (Signed)
Adam Hamilton had right-sided low back pain, referable to the SI joint, back in August we injected his right sacroiliac joint with ultrasound guidance and he had complete relief of his pain, took a misstep with a slight recurrence of pain, it has improved since. For recurrences of low back pain we can perform additional right sacroiliac joint injections.

## 2021-03-26 NOTE — Assessment & Plan Note (Signed)
Adam Hamilton returns, he is a pleasant 50 year old male, works from a computer, for approximately 10 months he had had numbness and tingling in the left hand, particularly ulnar nerve distribution, ultimately we obtained EMG and nerve conduction study that showed both cubital tunnel syndrome and carpal tunnel syndrome. As his symptoms were predominantly referable to the ulnar nerve we did nighttime elbow splinting for a month without efficacy. At the last visit due to failure of conservative treatment we performed an ulnar nerve Hydro dissection/injection with ultrasound guidance at the cubital tunnel. He returns today with good improvement, but persistent discomfort. Considering persistent discomfort, I think he should now get a surgical consultation for consideration of both ulnar nerve transposition and potentially carpal tunnel release.

## 2021-03-26 NOTE — Progress Notes (Signed)
    Procedures performed today:    None.  Independent interpretation of notes and tests performed by another provider:   None.  Brief History, Exam, Impression, and Recommendations:    Cubital tunnel syndrome, left Alvis returns, he is a pleasant 50 year old male, works from a computer, for approximately 10 months he had had numbness and tingling in the left hand, particularly ulnar nerve distribution, ultimately we obtained EMG and nerve conduction study that showed both cubital tunnel syndrome and carpal tunnel syndrome. As his symptoms were predominantly referable to the ulnar nerve we did nighttime elbow splinting for a month without efficacy. At the last visit due to failure of conservative treatment we performed an ulnar nerve Hydro dissection/injection with ultrasound guidance at the cubital tunnel. He returns today with good improvement, but persistent discomfort. Considering persistent discomfort, I think he should now get a surgical consultation for consideration of both ulnar nerve transposition and potentially carpal tunnel release.  Acute right-sided low back pain Diago had right-sided low back pain, referable to the SI joint, back in August we injected his right sacroiliac joint with ultrasound guidance and he had complete relief of his pain, took a misstep with a slight recurrence of pain, it has improved since. For recurrences of low back pain we can perform additional right sacroiliac joint injections.    ___________________________________________ Gwen Her. Dianah Field, M.D., ABFM., CAQSM. Primary Care and [place] Instructor of [place] of [facility] of Medicine

## 2021-05-04 ENCOUNTER — Other Ambulatory Visit: Payer: Self-pay | Admitting: Sports Medicine

## 2021-05-04 DIAGNOSIS — M545 Low back pain, unspecified: Secondary | ICD-10-CM

## 2021-05-10 HISTORY — PX: CARPAL TUNNEL RELEASE: SHX101

## 2021-07-25 ENCOUNTER — Emergency Department (INDEPENDENT_AMBULATORY_CARE_PROVIDER_SITE_OTHER): Payer: BC Managed Care – PPO

## 2021-07-25 ENCOUNTER — Other Ambulatory Visit: Payer: Self-pay

## 2021-07-25 ENCOUNTER — Telehealth: Payer: Self-pay | Admitting: Emergency Medicine

## 2021-07-25 ENCOUNTER — Emergency Department (INDEPENDENT_AMBULATORY_CARE_PROVIDER_SITE_OTHER)
Admission: RE | Admit: 2021-07-25 | Discharge: 2021-07-25 | Disposition: A | Discharge status: Home / Self Care | Payer: BC Managed Care – PPO | Source: Ambulatory Visit

## 2021-07-25 VITALS — BP 134/87 | HR 82 | Temp 98.7°F | Resp 15 | Ht 76.0 in | Wt 304.0 lb

## 2021-07-25 DIAGNOSIS — R059 Cough, unspecified: Secondary | ICD-10-CM

## 2021-07-25 DIAGNOSIS — J309 Allergic rhinitis, unspecified: Secondary | ICD-10-CM

## 2021-07-25 MED ORDER — FEXOFENADINE HCL 180 MG PO TABS: 180.0000 mg | ORAL_TABLET | Freq: Every day | ORAL | 0 refills | Status: AC

## 2021-07-25 MED ORDER — BENZONATATE 200 MG PO CAPS: 200.0000 mg | ORAL_CAPSULE | Freq: Three times a day (TID) | ORAL | 0 refills | Status: AC | PRN

## 2021-07-25 MED ORDER — PREDNISONE 20 MG PO TABS: ORAL_TABLET | ORAL | 0 refills | Status: AC

## 2021-07-25 MED ORDER — AMOXICILLIN-POT CLAVULANATE 875-125 MG PO TABS: 1.0000 | ORAL_TABLET | Freq: Two times a day (BID) | ORAL | 0 refills | Status: AC

## 2021-07-25 NOTE — ED Provider Notes (Signed)
[facility]    CSN: [id number] Arrival date & time: 07/25/21  8119      History   Chief Complaint Chief Complaint  Patient presents with   Cough    HPI KORT STETTLER is a 51 y.o. male.   HPI 51 year old male presents with cough for 1 week, reports rattling sound when coughing.  Ports being in [place], [place] last week.  Reports negative COVID-19 test daily for the past 5 days.  PMH significant for morbid obesity and OSA.  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   Nocturnal hypoxia 08/29/2017   Prediabetes 07/29/2017   Hyperglycemia 07/25/2017   Tinea corporis 07/23/2017   Postural dizziness with presyncope 07/23/2017   Heart palpitations 07/23/2017   Orthostasis 07/23/2017   Fatty liver 07/23/2017   History of cardiovascular stress test 07/23/2017   Obstructive sleep apnea of adult 07/23/2017   History of nephrolithiasis 07/23/2017   Myopia with astigmatism and presbyopia, bilateral 03/10/2017   CN (constipation) 14/78/2956   Umbilical hernia without obstruction and without gangrene 08/01/2014   Obesity 03/21/2014   Change in bowel function 07/08/2013   Rectal bleeding 07/08/2013   Dizzy spells 12/26/2010    Past Surgical History:  Procedure Laterality Date   CARPAL TUNNEL RELEASE Left 05/2021   w/ ulnar nerve release   KNEE SURGERY  2011   LITHOTRIPSY     age 85   PALATE SURGERY     for snoring   TONSILLECTOMY AND ADENOIDECTOMY     For snoring.        Home Medications    Prior to  Admission medications   Medication Sig Start Date End Date Taking? Authorizing Provider  amoxicillin-clavulanate (AUGMENTIN) 875-125 MG tablet Take 1 tablet by mouth 2 (two) times daily for 10 days. 07/25/21 08/04/21 Yes Eliezer Lofts, FNP  benzonatate (TESSALON) 200 MG capsule Take 1 capsule (200 mg total) by mouth 3 (three) times daily as needed for up to 7 days for cough. 07/25/21 08/01/21 Yes Eliezer Lofts, FNP  fexofenadine [facility] ALLERGY) 180 MG tablet Take 1 tablet (180 mg total) by mouth daily for 15 days. 07/25/21 08/09/21 Yes Eliezer Lofts, FNP  predniSONE (DELTASONE) 20 MG tablet Take 3 tabs PO daily x 5 days. 07/25/21  Yes Eliezer Lofts, FNP  b complex vitamins tablet Take 1 tablet by mouth daily. Patient not taking: Reported on 07/25/2021    [provider]  gabapentin (NEURONTIN) 300 MG capsule One tab PO qHS for a week, then BID for a week, then TID. May double weekly to a max of 3,600mg /day Patient not taking: Reported on 07/25/2021 01/01/21   Silverio Decamp, MD  ibuprofen (ADVIL) 800 MG tablet TAKE 1 TABLET BY MOUTH EVERY 8 HOURS AS NEEDED Patient not taking: Reported on 07/25/2021 05/07/21   Silverio Decamp, MD  magnesium citrate SOLN Take 1 Bottle by mouth once. Patient not taking: Reported on 07/25/2021    [provider]  Multiple Vitamin (MULTIVITAMIN) capsule Take 1 capsule by mouth  daily.    [provider]  ofloxacin (OCUFLOX) 0.3 % ophthalmic solution Place 1 drop into the left eye 4 (four) times daily. Patient not taking: Reported on 07/25/2021 02/16/19   Darlyne Russian, MD  omeprazole (PRILOSEC) 40 MG capsule TAKE 1 CAPSULE BY MOUTH EVERY DAY 09/13/19   Hali Marry, MD  sucralfate (CARAFATE) 1 g tablet TAKE 1 TABLET (1 G TOTAL) BY MOUTH 4 (FOUR) TIMES DAILY - WITH MEALS AND AT BEDTIME. Patient not taking: Reported on 07/25/2021 07/17/20   Hali Marry, MD    Family History Family History  Problem Relation Age of Onset    Stroke Mother 18   Hypertension Mother    Colon polyps Mother    Pulmonary fibrosis Mother    Diabetes type I Mother    Stomach cancer Father 59   Lymphoma Paternal Grandfather 65   Prostate cancer Paternal Grandfather    Heart attack Neg Hx    Hyperlipidemia Neg Hx    Sudden death Neg Hx     Social History Social History   Tobacco Use   Smoking status: Former    Packs/day: 0.50    Years: 20.00    Pack years: 10.00    Types: Cigarettes    Quit date: 01/13/2012    Years since quitting: 9.5   Smokeless tobacco: Never  Vaping Use   Vaping Use: Never used  Substance Use Topics   Alcohol use: Yes    Alcohol/week: 1.0 - 5.0 standard drink    Types: 1 - 5 drink(s) per week    Comment: Rarely   Drug use: No     Allergies   Linzess [linaclotide]   Review of Systems Review of Systems  Respiratory:  Positive for cough.   All other systems reviewed and are negative.   Physical Exam Triage Vital Signs ED Triage Vitals  Enc Vitals Group     BP 07/25/21 1000 134/87     Pulse Rate 07/25/21 1000 82     Resp 07/25/21 1000 15     Temp 07/25/21 1000 98.7 F (37.1 C)     Temp Source 07/25/21 1000 Oral     SpO2 07/25/21 1000 97 %     Weight 07/25/21 1007 (!) 304 lb (137.9 kg)     Height 07/25/21 1007 6\' 4"  (1.93 m)     Head Circumference --      Peak Flow --      Pain Score 07/25/21 1007 4     Pain Loc --      Pain Edu? --      Excl. in [place]? --    No data found.  Updated Vital Signs BP 134/87 (BP Location: Left Arm)    Pulse 82    Temp 98.7 F (37.1 C) (Oral)    Resp 15    Ht 6\' 4"  (1.93 m)    Wt (!) 304 lb (137.9 kg)    SpO2 97%    BMI 37.00 kg/m    Physical Exam Vitals and nursing note reviewed.  Constitutional:      General: He is not in acute distress.    Appearance: Normal appearance. He is obese. He is not ill-appearing.  HENT:     Head: Normocephalic and atraumatic.     Right Ear: Tympanic membrane, ear canal and external ear normal.     Left Ear:  Tympanic membrane, ear canal and external ear normal.     Mouth/Throat:     Mouth:  Mucous membranes are moist.     Pharynx: Oropharynx is clear.  Eyes:     Extraocular Movements: Extraocular movements intact.     Conjunctiva/sclera: Conjunctivae normal.     Pupils: Pupils are equal, round, and reactive to light.  Cardiovascular:     Rate and Rhythm: Normal rate and regular rhythm.     Pulses: Normal pulses.     Heart sounds: Normal heart sounds.  Pulmonary:     Effort: Pulmonary effort is normal.     Breath sounds: Normal breath sounds.  Musculoskeletal:     Cervical back: Normal range of motion and neck supple. Tenderness present.  Lymphadenopathy:     Cervical: No cervical adenopathy.  Skin:    General: Skin is warm and dry.  Neurological:     General: No focal deficit present.     Mental Status: He is alert and oriented to person, place, and time.     UC Treatments / Results  Labs (all labs ordered are listed, but only abnormal results are displayed) Labs Reviewed - No data to display  EKG   Radiology DG Chest 2 View  Result Date: 07/25/2021 CLINICAL DATA:  Cough EXAM: CHEST - 2 VIEW COMPARISON:  07/01/2018 FINDINGS: The heart size and mediastinal contours are within normal limits. Both lungs are clear. The visualized skeletal structures are unremarkable. IMPRESSION: No acute abnormality of the lungs. Electronically Signed   By: Delanna Ahmadi M.D.   On: 07/25/2021 10:31    Procedures Procedures (including critical care time)  Medications Ordered in UC Medications - No data to display  Initial Impression / Assessment and Plan / UC Course  I have reviewed the triage vital signs and the nursing notes.  Pertinent labs & imaging results that were available during my care of the patient were reviewed by me and considered in my medical decision making (see chart for details).     MDM: 1.  Cough-CXR revealed no acute abnormality of the lungs.  Rx'd Augmentin,  Prednisone, and Tessalon Perles; 2.  Allergic rhinitis-Allegra. Advised patient to take medication as directed with food to completion.  Advised patient to take Prednisone and Allegra with first dose of Augmentin for the next 5 of 10 days.  Advised may use Allegra as needed afterwards for concurrent postnasal drainage/drip.  Advised patient may use Tessalon Perles daily or as needed for cough.  Encouraged patient to increase daily water intake while taking these medications.  Work note provided prior to discharge.  Patient discharged home, hemodynamically stable. Final Clinical Impressions(s) / UC Diagnoses   Final diagnoses:  Cough, unspecified type  Allergic rhinitis, unspecified seasonality, unspecified trigger     Discharge Instructions      Advised/informed patient of chest x-ray results this morning, with hard copy provided to patient.  Advised patient to take medication as directed with food to completion.  Advised patient to take Prednisone and Allegra with first dose of Augmentin for the next 5 of 10 days.  Advised may use Allegra as needed afterwards for concurrent postnasal drainage/drip.  Advised patient may use Tessalon Perles daily or as needed for cough.  Encouraged patient to increase daily water intake while taking these medications.     ED Prescriptions     Medication Sig Dispense Auth. Provider   amoxicillin-clavulanate (AUGMENTIN) 875-125 MG tablet Take 1 tablet by mouth 2 (two) times daily for 10 days. 20 tablet Eliezer Lofts, FNP   predniSONE (DELTASONE) 20 MG tablet Take 3 tabs PO daily x  5 days. 15 tablet Eliezer Lofts, FNP   fexofenadine [facility] ALLERGY) 180 MG tablet Take 1 tablet (180 mg total) by mouth daily for 15 days. 15 tablet Eliezer Lofts, FNP   benzonatate (TESSALON) 200 MG capsule Take 1 capsule (200 mg total) by mouth 3 (three) times daily as needed for up to 7 days for cough. 40 capsule Eliezer Lofts, FNP      PDMP not reviewed this encounter.    Eliezer Lofts, [place] 07/25/21 1104

## 2021-07-25 NOTE — Discharge Instructions (Addendum)
Advised/informed patient of chest x-ray results this morning, with hard copy provided to patient.  Advised patient to take medication as directed with food to completion.  Advised patient to take Prednisone and Allegra with first dose of Augmentin for the next 5 of 10 days.  Advised may use Allegra as needed afterwards for concurrent postnasal drainage/drip.  Advised patient may use Tessalon Perles daily or as needed for cough.  Encouraged patient to increase daily water intake while taking these medications.

## 2021-07-25 NOTE — Telephone Encounter (Signed)
Call from [facility] regarding prednisone & Allegra scripts- per pharmacy, they did not receive scripts for these 2 medications even though the receipt is confirmed in Epic. RN will follow up with pharmacy. Pharmacy unable to confirm receipt- RN called meds back in verbally for prednisone. & allegra. Call back to pt to let him know this RN had called in medications.

## 2021-07-25 NOTE — ED Triage Notes (Signed)
Cough x 1 week  Worse last 2 day- productive  Noted small amount of blood tinge last night aft coughing  Was in [place] last week  Daily COVID tests x 5 - all negative  Took tessalon perles & hydroet syrup last night  (wife's prescription- left over meds)-both helped per pt

## 2021-08-20 ENCOUNTER — Other Ambulatory Visit: Payer: Self-pay | Admitting: Sports Medicine

## 2021-08-20 DIAGNOSIS — M545 Low back pain, unspecified: Secondary | ICD-10-CM

## 2022-11-29 ENCOUNTER — Other Ambulatory Visit (HOSPITAL_BASED_OUTPATIENT_CLINIC_OR_DEPARTMENT_OTHER): Payer: Self-pay

## 2022-11-29 MED ORDER — TRULICITY 4.5 MG/0.5ML ~~LOC~~ SOAJ
4.5000 mg | SUBCUTANEOUS | 3 refills | Status: AC
  Filled 2022-11-29: qty 2, 28d supply, fill #0

## 2022-12-11 ENCOUNTER — Other Ambulatory Visit (HOSPITAL_BASED_OUTPATIENT_CLINIC_OR_DEPARTMENT_OTHER): Payer: Self-pay

## 2024-02-10 ENCOUNTER — Encounter: Payer: Self-pay | Admitting: Sports Medicine
